# Patient Record
Sex: Female | Born: 1965 | Race: White | Hispanic: No | Marital: Married | Smoking: Never smoker
Health system: Southern US, Community
[De-identification: ages and names within clinical notes are randomized; demographics above are authoritative.]

## PROBLEM LIST (undated history)

## (undated) DIAGNOSIS — C44519 Basal cell carcinoma of skin of other part of trunk: Secondary | ICD-10-CM

## (undated) DIAGNOSIS — E785 Hyperlipidemia, unspecified: Secondary | ICD-10-CM

## (undated) DIAGNOSIS — Z8639 Personal history of other endocrine, nutritional and metabolic disease: Secondary | ICD-10-CM

## (undated) DIAGNOSIS — I10 Essential (primary) hypertension: Secondary | ICD-10-CM

## (undated) DIAGNOSIS — IMO0001 Reserved for inherently not codable concepts without codable children: Secondary | ICD-10-CM

## (undated) DIAGNOSIS — K219 Gastro-esophageal reflux disease without esophagitis: Secondary | ICD-10-CM

## (undated) HISTORY — DX: Hyperlipidemia, unspecified: E78.5

## (undated) HISTORY — DX: Personal history of other endocrine, nutritional and metabolic disease: Z86.39

## (undated) HISTORY — DX: Basal cell carcinoma of skin of other part of trunk: C44.519

## (undated) HISTORY — DX: Gastro-esophageal reflux disease without esophagitis: K21.9

## (undated) HISTORY — DX: Reserved for inherently not codable concepts without codable children: IMO0001

## (undated) HISTORY — PX: OTHER SURGICAL HISTORY: SHX169

## (undated) HISTORY — DX: Essential (primary) hypertension: I10

---

## 1998-07-15 ENCOUNTER — Other Ambulatory Visit: Admission: RE | Admit: 1998-07-15 | Discharge: 1998-07-15 | Payer: Self-pay | Admitting: Obstetrics and Gynecology

## 1999-06-23 ENCOUNTER — Other Ambulatory Visit: Admission: RE | Admit: 1999-06-23 | Discharge: 1999-06-23 | Payer: Self-pay | Admitting: Gynecology

## 1999-09-29 ENCOUNTER — Encounter: Admission: RE | Admit: 1999-09-29 | Discharge: 1999-12-28 | Payer: Self-pay | Admitting: Obstetrics and Gynecology

## 2000-01-04 ENCOUNTER — Inpatient Hospital Stay (HOSPITAL_COMMUNITY): Admission: AD | Admit: 2000-01-04 | Discharge: 2000-01-06 | Payer: Self-pay | Admitting: Gynecology

## 2000-01-07 ENCOUNTER — Encounter: Admission: RE | Admit: 2000-01-07 | Discharge: 2000-02-05 | Payer: Self-pay | Admitting: Gynecology

## 2000-02-19 ENCOUNTER — Other Ambulatory Visit: Admission: RE | Admit: 2000-02-19 | Discharge: 2000-02-19 | Payer: Self-pay | Admitting: Gynecology

## 2001-04-26 ENCOUNTER — Other Ambulatory Visit: Admission: RE | Admit: 2001-04-26 | Discharge: 2001-04-26 | Payer: Self-pay | Admitting: Gynecology

## 2001-11-03 ENCOUNTER — Inpatient Hospital Stay (HOSPITAL_COMMUNITY): Admission: AD | Admit: 2001-11-03 | Discharge: 2001-11-05 | Payer: Self-pay | Admitting: Gynecology

## 2001-12-15 ENCOUNTER — Other Ambulatory Visit: Admission: RE | Admit: 2001-12-15 | Discharge: 2001-12-15 | Payer: Self-pay | Admitting: Gynecology

## 2005-03-24 ENCOUNTER — Other Ambulatory Visit: Admission: RE | Admit: 2005-03-24 | Discharge: 2005-03-24 | Payer: Self-pay | Admitting: Gynecology

## 2007-04-01 ENCOUNTER — Encounter: Admission: RE | Admit: 2007-04-01 | Discharge: 2007-04-01 | Payer: Self-pay | Admitting: Gynecology

## 2007-06-10 ENCOUNTER — Other Ambulatory Visit: Admission: RE | Admit: 2007-06-10 | Discharge: 2007-06-10 | Payer: Self-pay | Admitting: Gynecology

## 2010-12-12 NOTE — Discharge Summary (Signed)
[facility]  Patient:    Misty Lara, Misty Lara                         MRN: [id number] Adm. Date:  [id number] Disc. Date: [id number] Attending:  Merrily Pew Dictator:   Antony Contras, [facility]                           Discharge Summary  DISCHARGE DIAGNOSES:          1. Intrauterine pregnancy at 38+ weeks.                               2. Insulin dependent gestational diabetes.  PROCEDURES:                   1. Mityvac assisted vaginal delivery of a                                  viable female infant.                               2. Repair of vaginal laceration.  HISTORY OF PRESENT ILLNESS:   The patient is a 45 year old gravida 2, para 0-0-1-0 with an LMP of March 30, 1999 and an [facility] of January 12, 2000. Her pregnancy was complicated by gestational diabetes requiring insulin management.  PRENATAL LABS:                Blood type A positive.  Antibody screen negative.  Rubella immune.  RPR, HBSAG, and HIV nonreactive.  MSAFP was within normal limits.  GBS negative.  HOSPITAL COURSE:              The patient presented on January 04, 2000 with regular contractions every five minutes.  The cervix was 3-4 cm, 95% effaced, vertex at -2 station.  Morning glucose was 76.  Labor did progress to complete dilatation.  Delivery was accomplished with Mityvac assistance.  The patient was delivered of an Apgar 8 and 15 female infant weighing 7 pounds 12 ounces over no episiotomy, but a vaginal laceration, which was successfully repaired with 3-0 Vicryl.  Her postpartum course was uncomplicated.  She remained afebrile.  No difficulty voiding.  Postpartum CBC showed hematocrit 27, hemoglobin 9.2, white count 17.1, platelets 193.  She was able to be discharged on her second postpartum day in satisfactory condition.  DISPOSITION:                  Follow up in the office in six weeks.  COntinue prenatal vitamins and iron.  Motrin and Tylox for pain. DD:  01/27/00 TD:   01/27/00 Job: [id number]

## 2010-12-12 NOTE — H&P (Signed)
[place]  Patient:    Misty Lara, Misty Lara                         MRN: [id number] Adm. Date:  01/04/00 Attending:  Marcial Pacas P. Fontaine, M.D.                         History and Physical  CHIEF COMPLAINT: 1. Pregnancy, at term. 2. Insulin-dependent gestational diabetic.  HISTORY OF PRESENT ILLNESS:  This 45 year old GII, P0, ABI female at 38+ weeks gestation, enters with regular contractions every five minutes at 3-4 cm, 95% vertex presentation, -2 station.  The patients pregnancy has been complicated by insulin dependent diabetes with good glucose control, for which she takes 10 units NPH at bedtime, 10 units NPH q.a.m.  PAST MEDICAL HISTORY:  Insulin dependent gestational diabetic.  PAST SURGICAL HISTORY:  None.  ALLERGIES:  Nonsteroidal anti-inflammatory.  FAMILY HISTORY:  Noncontributory.  REVIEW OF SYSTEMS:  Noncontributory.  SOCIAL HISTORY:  Noncontributory.  PHYSICAL EXAMINATION:  VITAL SIGNS:  Stable; afebrile.  HEENT:  Normal.  LUNGS:  Clear.  CARDIAC:  Regular rate without murmurs, rubs or gallops.  ABDOMEN:  Gravid, vertex presentation.  External monitor shows contractions every three minutes with reactive fetal tracing.  PELVIC:  Cervix complete, 6 cm and -1/-2 station; bulging _________, artificial  rupture of membranes, clear fluid.  ASSESSMENT: 85. 45 year old GI, P0, ABI female at 38+ weeks. 2. Insulin dependent gestational diabetic.  PLAN:  A.M. glucose now 76, in active labor.  Will monitor glucose throughout labor.  Withhold insulin protocol at present, unless necessary in anticipation f vaginal delivery. DD:  01/04/00 TD:  01/04/00 Job: [id number]

## 2010-12-12 NOTE — Discharge Summary (Signed)
[facility]  Patient:    Misty Lara, Misty Lara Visit Number: [id number] MRN: [id number]          Service Type: OBS Location: [id number] Attending Physician:  Merrily Pew Dictated by:   Antony Contras, [facility] Admit Date:  11/03/2001 Discharge Date: 11/05/2001                             Discharge Summary  DISCHARGE DIAGNOSES:          1. Intrauterine pregnancy at 36 to 37 weeks.                               2. Gestational diabetes, insulin dependent.  PROCEDURE:                    Normal spontaneous vaginal delivery of viable infant over intact perineum with repair of small laceration.  HISTORY OF PRESENT ILLNESS:   The patient is a 45 year old, gravida 3, para 1-0-1-1, with LMP of February 21, 2001, [facility] Nov 28, 2001.  Prenatal course was complicated by gestational diabetes for which the patient was placed on NPH insulin.  LABORATORY DATA:              Blood type A positive, antibody screen negative. RPR, HBSAG, HIV nonreactive.  MSAFP normal.  GBS negative.  HOSPITAL COURSE:              The patient was admitted on November 03, 2001, at 36 to 37 weeks with spontaneous onset of labor.  She progressed to complete dilatation and delivered an Apgars 9 and 46 female infant weighing 7 pounds 7 ounces over an intact perineum with repair of small laceration.  Postpartum course, the patient remained afebrile with no difficulty voiding. She was discharged in satisfactory condition on her second postpartum day. CBC; hematocrit 30.5, hemoglobin 10.5, WBC 11.8, platelets 160.  DISPOSITION:                  Follow up in six weeks.  Continue prenatal vitamins and iron.  Motrin or Tylox for pain. Dictated by:   Antony Contras, [facility] Attending Physician:  Merrily Pew DD:  11/28/01 TD:  11/30/01 Job: [id number]

## 2012-04-07 ENCOUNTER — Other Ambulatory Visit: Payer: Self-pay | Admitting: Gynecology

## 2012-04-07 DIAGNOSIS — Z1231 Encounter for screening mammogram for malignant neoplasm of breast: Secondary | ICD-10-CM

## 2012-04-26 ENCOUNTER — Ambulatory Visit
Admission: RE | Admit: 2012-04-26 | Discharge: 2012-04-26 | Disposition: A | Discharge status: Home / Self Care | Payer: 59 | Source: Ambulatory Visit | Attending: Gynecology | Admitting: Gynecology

## 2012-04-26 DIAGNOSIS — Z1231 Encounter for screening mammogram for malignant neoplasm of breast: Secondary | ICD-10-CM

## 2012-05-09 DIAGNOSIS — I10 Essential (primary) hypertension: Secondary | ICD-10-CM | POA: Insufficient documentation

## 2012-05-12 ENCOUNTER — Ambulatory Visit: Payer: Self-pay | Admitting: Gynecology

## 2012-06-15 ENCOUNTER — Ambulatory Visit: Payer: Self-pay | Admitting: Gynecology

## 2012-06-16 ENCOUNTER — Encounter: Payer: Self-pay | Admitting: Gynecology

## 2012-06-16 ENCOUNTER — Ambulatory Visit (INDEPENDENT_AMBULATORY_CARE_PROVIDER_SITE_OTHER): Payer: 59 | Admitting: Gynecology

## 2012-06-16 ENCOUNTER — Other Ambulatory Visit (HOSPITAL_COMMUNITY)
Admission: RE | Admit: 2012-06-16 | Discharge: 2012-06-16 | Disposition: A | Discharge status: Home / Self Care | Payer: 59 | Source: Ambulatory Visit | Attending: Gynecology | Admitting: Gynecology

## 2012-06-16 VITALS — BP 120/70 | Ht 60.0 in | Wt 168.0 lb

## 2012-06-16 DIAGNOSIS — Z01419 Encounter for gynecological examination (general) (routine) without abnormal findings: Secondary | ICD-10-CM

## 2012-06-16 DIAGNOSIS — I10 Essential (primary) hypertension: Secondary | ICD-10-CM

## 2012-06-16 DIAGNOSIS — Z13220 Encounter for screening for lipoid disorders: Secondary | ICD-10-CM

## 2012-06-16 DIAGNOSIS — Z1151 Encounter for screening for human papillomavirus (HPV): Secondary | ICD-10-CM | POA: Insufficient documentation

## 2012-06-16 DIAGNOSIS — Z131 Encounter for screening for diabetes mellitus: Secondary | ICD-10-CM

## 2012-06-16 LAB — CBC WITH DIFFERENTIAL/PLATELET
Eosinophils Absolute: 0.1 10*3/uL (ref 0.0–0.7)
Eosinophils Relative: 1 % (ref 0–5)
Hemoglobin: 14.2 g/dL (ref 12.0–15.0)
Lymphocytes Relative: 25 % (ref 12–46)
Lymphs Abs: 1.7 10*3/uL (ref 0.7–4.0)
MCH: 31.3 pg (ref 26.0–34.0)
MCV: 91.6 fL (ref 78.0–100.0)
Monocytes Relative: 7 % (ref 3–12)
RBC: 4.54 MIL/uL (ref 3.87–5.11)

## 2012-06-16 NOTE — Progress Notes (Signed)
Misty Lara 23-Jul-1966 [id number]        46 y.o.  [id number] for annual exam.  Has not been in for several years.  Past medical history,surgical history, medications, allergies, family history and social history were all reviewed and documented in the EPIC chart. ROS:  Was performed and pertinent positives and negatives are included in the history.  Exam: [facility] assistant Filed Vitals:   06/16/12 0929  BP: 120/70  Height: 5' (1.524 m)  Weight: 168 lb (76.204 kg)   General appearance  Normal Skin grossly normal Head/Neck normal with no cervical or supraclavicular adenopathy thyroid normal Lungs  clear Cardiac RR, without RMG Abdominal  soft, nontender, without masses, organomegaly or hernia Breasts  examined lying and sitting without masses, retractions, discharge or axillary adenopathy. Pelvic  Ext/BUS/vagina  normal   Cervix  normal Pap/HPV  Uterus  anteverted, normal size, shape and contour, midline and mobile nontender   Adnexa  Without masses or tenderness    Anus and perineum  normal   Rectovaginal  normal sphincter tone without palpated masses or tenderness.    Assessment/Plan:  45 y.o. [id number] female for annual exam, regular menses, vasectomy birth control.   1. History gestational diabetes. Check glucose and hemoglobin A1c. Weight loss with diet/exercise reviewed. 2. Hypertension. Well controlled. Check comprehensive metabolic panel. 3. Mammography 04/2012. Continue with annual mammography. SBE monthly reviewed. 4. Pap smear. Pap/HPV done. No history of abnormal Pap smears previously. Plan 5 year screening if normal. 5. Colonoscopy. Patient has planned this week. Does have history of adenomatous polyps and is actively being followed by gastroenterology. 6. Health maintenance. Baseline CBC comprehensive metabolic panel lipid profile TSH and urinalysis ordered. Follow up one year, sooner as needed  Addendum: Hemoglobin A1c subsequently declined at lab by  patient.  Dara Lords MD, 10:19 AM 06/16/2012

## 2012-06-16 NOTE — Patient Instructions (Signed)
Follow up for lab work Follow up annual exam in one year

## 2012-06-17 ENCOUNTER — Encounter: Payer: Self-pay | Admitting: Gynecology

## 2012-06-17 LAB — LIPID PANEL
Cholesterol: 187 mg/dL (ref 0–200)
Triglycerides: 76 mg/dL (ref <150)
VLDL: 15 mg/dL (ref 0–40)

## 2012-06-17 LAB — COMPREHENSIVE METABOLIC PANEL
CO2: 24 mEq/L (ref 19–32)
Calcium: 9.5 mg/dL (ref 8.4–10.5)
Chloride: 102 mEq/L (ref 96–112)
Glucose, Bld: 198 mg/dL — ABNORMAL HIGH (ref 70–99)
Sodium: 135 mEq/L (ref 135–145)
Total Bilirubin: 0.6 mg/dL (ref 0.3–1.2)
Total Protein: 6.8 g/dL (ref 6.0–8.3)

## 2012-06-17 LAB — URINALYSIS W MICROSCOPIC + REFLEX CULTURE
Bacteria, UA: NONE SEEN
Bilirubin Urine: NEGATIVE
Casts: NONE SEEN
Crystals: NONE SEEN
Glucose, UA: NEGATIVE mg/dL
Ketones, ur: 15 mg/dL — AB
RBC / HPF: >50 RBC/hpf — AB (ref <3)
Specific Gravity, Urine: 1.021 (ref 1.005–1.030)
Squamous Epithelial / LPF: NONE SEEN
Urobilinogen, UA: 0.2 mg/dL (ref 0.0–1.0)
pH: 5 (ref 5.0–8.0)

## 2012-06-20 ENCOUNTER — Other Ambulatory Visit: Payer: Self-pay | Admitting: Gynecology

## 2012-06-20 ENCOUNTER — Telehealth: Payer: Self-pay | Admitting: Gynecology

## 2012-06-20 MED ORDER — SULFAMETHOXAZOLE-TRIMETHOPRIM 800-160 MG PO TABS: 1.0000 | ORAL_TABLET | Freq: Two times a day (BID) | ORAL | Status: DC

## 2012-06-20 NOTE — Telephone Encounter (Signed)
Patient informed. She opted for Rx.  Rx sent to her pharmacy.

## 2012-06-20 NOTE — Telephone Encounter (Signed)
Patient with low level bacteria in her urine.  Was on her menses and there was lots of red cells also seen. Options are to treat with short course of antibiotic such as Septra DS 1 by mouth twice a day x3 days or to repeat a clean-catch urine off menses. I leave it up to the patient.

## 2012-07-07 ENCOUNTER — Encounter: Payer: Self-pay | Admitting: Gynecology

## 2013-06-01 ENCOUNTER — Other Ambulatory Visit: Payer: Self-pay

## 2013-06-28 ENCOUNTER — Other Ambulatory Visit: Payer: Self-pay

## 2013-06-28 DIAGNOSIS — Z1231 Encounter for screening mammogram for malignant neoplasm of breast: Secondary | ICD-10-CM

## 2013-07-18 ENCOUNTER — Encounter: Payer: Self-pay | Admitting: Women's Health

## 2013-08-09 ENCOUNTER — Ambulatory Visit: Payer: 59

## 2013-08-28 ENCOUNTER — Encounter: Payer: Self-pay | Admitting: Gynecology

## 2013-08-30 ENCOUNTER — Ambulatory Visit
Admission: RE | Admit: 2013-08-30 | Discharge: 2013-08-30 | Disposition: A | Discharge status: Home / Self Care | Payer: BC Managed Care – PPO | Source: Ambulatory Visit

## 2013-08-30 DIAGNOSIS — Z1231 Encounter for screening mammogram for malignant neoplasm of breast: Secondary | ICD-10-CM

## 2013-09-04 ENCOUNTER — Other Ambulatory Visit: Payer: Self-pay | Admitting: Gynecology

## 2013-09-04 DIAGNOSIS — R928 Other abnormal and inconclusive findings on diagnostic imaging of breast: Secondary | ICD-10-CM

## 2013-09-13 ENCOUNTER — Other Ambulatory Visit: Payer: BC Managed Care – PPO

## 2013-09-27 ENCOUNTER — Ambulatory Visit
Admission: RE | Admit: 2013-09-27 | Discharge: 2013-09-27 | Disposition: A | Discharge status: Home / Self Care | Payer: BC Managed Care – PPO | Source: Ambulatory Visit | Attending: Gynecology | Admitting: Gynecology

## 2013-09-27 ENCOUNTER — Other Ambulatory Visit: Payer: Self-pay | Admitting: Gynecology

## 2013-09-27 DIAGNOSIS — R2232 Localized swelling, mass and lump, left upper limb: Secondary | ICD-10-CM

## 2013-09-27 DIAGNOSIS — R928 Other abnormal and inconclusive findings on diagnostic imaging of breast: Secondary | ICD-10-CM

## 2014-05-28 ENCOUNTER — Encounter: Payer: Self-pay | Admitting: Gynecology

## 2015-04-15 ENCOUNTER — Ambulatory Visit (INDEPENDENT_AMBULATORY_CARE_PROVIDER_SITE_OTHER): Payer: BLUE CROSS/BLUE SHIELD | Admitting: Gynecology

## 2015-04-15 ENCOUNTER — Encounter: Payer: Self-pay | Admitting: Gynecology

## 2015-04-15 ENCOUNTER — Other Ambulatory Visit (HOSPITAL_COMMUNITY)
Admission: RE | Admit: 2015-04-15 | Discharge: 2015-04-15 | Disposition: A | Discharge status: Home / Self Care | Payer: BLUE CROSS/BLUE SHIELD | Source: Ambulatory Visit | Attending: Gynecology | Admitting: Gynecology

## 2015-04-15 VITALS — BP 124/80 | Ht 61.0 in | Wt 167.0 lb

## 2015-04-15 DIAGNOSIS — Z01411 Encounter for gynecological examination (general) (routine) with abnormal findings: Secondary | ICD-10-CM | POA: Insufficient documentation

## 2015-04-15 DIAGNOSIS — Z01419 Encounter for gynecological examination (general) (routine) without abnormal findings: Secondary | ICD-10-CM

## 2015-04-15 NOTE — Progress Notes (Signed)
Misty Lara 09-11-1965 [id number]        49 y.o.  [id number] for annual exam.  Doing well without complaints.  Past medical history,surgical history, problem list, medications, allergies, family history and social history were all reviewed and documented as reviewed in the EPIC chart.  ROS:  Performed with pertinent positives and negatives included in the history, assessment and plan.   Additional significant findings :  none   Exam: [facility] person Vitals:   04/15/15 1420  BP: 124/80  Height:  (1.549 m)  Weight: 167 lb (75.751 kg)   General appearance:  Normal affect, orientation and appearance. Skin: Grossly normal HEENT: Without gross lesions.  No cervical or supraclavicular adenopathy. Thyroid normal.  Lungs:  Clear without wheezing, rales or rhonchi Cardiac: RR, without RMG Abdominal:  Soft, nontender, without masses, guarding, rebound, organomegaly or hernia Breasts:  Examined lying and sitting without masses, retractions, discharge or axillary adenopathy. Pelvic:  Ext/BUS/vagina slight vaginal staining as menses started today  Cervix grossly normal. Pap smear done  Uterus anteverted, normal size, shape and contour, midline and mobile nontender   Adnexa  Without masses or tenderness    Anus and perineum  Normal   Rectovaginal  Normal sphincter tone without palpated masses or tenderness.    Assessment/Plan:  49 y.o. [id number] female for annual exam with regular menses, vasectomy birth control.   1. Mammography 09/2013. Patient reminded she is overdue and agrees to schedule. SBE monthly reviewed. 2. Pap smear/HPV negative 2013. Pap smear done today. No history of abnormal Pap smears previously. 3. Colonoscopy 2013 with recommended repeat interval 5 years. 4. Health maintenance. Being followed by her primary physician for hypertension. Has all of her routine blood work done through their office and will follow up with them. Follow up in one year, sooner as  needed.   Dara Lords MD, 2:45 PM 04/15/2015

## 2015-04-15 NOTE — Patient Instructions (Signed)
Call to Schedule your mammogram  Facilities in [place]: 1)  [facility], [place] Phone: [phone])  [facility]. [address] [place] Phone: [phone] 3)  Dr. Isaiah Blakes at [facility] [address] Phone: [phone]     Mammogram A mammogram is an X-ray test to find changes in a woman's breast. You should get a mammogram if:  You are 48 years of age or older  You have risk factors.   Your doctor recommends that you have one.  BEFORE THE TEST  Do not schedule the test the week before your period, especially if your breasts are sore during this time.  On the day of your mammogram:  Wash your breasts and armpits well. After washing, do not put on any deodorant or talcum powder on until after your test.   Eat and drink as you usually do.   Take your medicines as usual.   If you are diabetic and take insulin, make sure you:   Eat before coming for your test.   Take your insulin as usual.   If you cannot keep your appointment, call before the appointment to cancel. Schedule another appointment.  TEST  You will need to undress from the waist up. You will put on a hospital gown.   Your breast will be put on the mammogram machine, and it will press firmly on your breast with a piece of plastic called a compression paddle. This will make your breast flatter so that the machine can X-ray all parts of your breast.   Both breasts will be X-rayed. Each breast will be X-rayed from above and from the side. An X-ray might need to be taken again if the picture is not good enough.   The mammogram will last about 15 to 30 minutes.  AFTER THE TEST Finding out the results of your test Ask when your test results will be ready. Make sure you get your test results.  Document Released: 10/09/2008 Document Revised: 07/02/2011 Document Reviewed: 10/09/2008 [facility] Patient  Information 2012 [place].  You may obtain a copy of any labs that were done today by logging onto MyChart as outlined in the instructions provided with your AVS (after visit summary). The office will not call with normal lab results but certainly if there are any significant abnormalities then we will contact you.   Health Maintenance Adopting a healthy lifestyle and getting preventive care can go a long way to promote health and wellness. Talk with your health care Scharlene Catalina about what schedule of regular examinations is right for you. This is a good chance for you to check in with your Thereasa Iannello about disease prevention and staying healthy. In between checkups, there are plenty of things you can do on your own. Experts have done a lot of research about which lifestyle changes and preventive measures are most likely to keep you healthy. Ask your health care Jazzmyn Filion for more information. WEIGHT AND DIET  Eat a healthy diet  Be sure to include plenty of vegetables, fruits, low-fat dairy products, and lean protein.  Do not eat a lot of foods high in solid fats, added sugars, or salt.  Get regular exercise. This is one of the most important things you can do for your health. 1. Most adults should exercise for at least 150 minutes each week. The exercise should increase your heart rate and make you sweat (moderate-intensity exercise).  2. Most adults should also do strengthening exercises at least twice a week. This is in addition to the moderate-intensity exercise.  Maintain a healthy weight  Body mass index (BMI) is a measurement that can be used to identify possible weight problems. It estimates body fat based on height and weight. Your health care Caylon Saine can help determine your BMI and help you achieve or maintain a healthy weight.  For females 34 years of age and older:  1. A BMI below 18.5 is considered underweight. 2. A BMI of 18.5 to 24.9 is normal. 3. A BMI of 25 to 29.9 is  considered overweight. 4. A BMI of 30 and above is considered obese.  Watch levels of cholesterol and blood lipids  You should start having your blood tested for lipids and cholesterol at 48 years of age, then have this test every 5 years.  You may need to have your cholesterol levels checked more often if: 1. Your lipid or cholesterol levels are high. 2. You are older than 48 years of age. 3. You are at high risk for heart disease.  CANCER SCREENING   Lung Cancer  Lung cancer screening is recommended for adults 60-55 years old who are at high risk for lung cancer because of a history of smoking.  A yearly low-dose CT scan of the lungs is recommended for people who: 1. Currently smoke. 2. Have quit within the past 15 years. 3. Have at least a 30-pack-year history of smoking. A pack year is smoking an average of one pack of cigarettes a day for 1 year.  Yearly screening should continue until it has been 15 years since you quit.  Yearly screening should stop if you develop a health problem that would prevent you from having lung cancer treatment.  Breast Cancer  Practice breast self-awareness. This means understanding how your breasts normally appear and feel.  It also means doing regular breast self-exams. Let your health care Aizlyn Schifano know about any changes, no matter how small.  If you are in your 20s or 30s, you should have a clinical breast exam (CBE) by a health care Latasha Puskas every 1-3 years as part of a regular health exam.  If you are 82 or older, have a CBE every year. Also consider having a breast X-ray (mammogram) every year.  If you have a family history of breast cancer, talk to your health care Aleea Hendry about genetic screening.  If you are at high risk for breast cancer, talk to your health care Kiyaan Haq about having an MRI and a mammogram every year.  Breast cancer gene (BRCA) assessment is recommended for women who have family members with BRCA-related cancers.  BRCA-related cancers include:  Breast.  Ovarian.  Tubal.  Peritoneal cancers.  Results of the assessment will determine the need for genetic counseling and BRCA1 and BRCA2 testing. Cervical Cancer Routine pelvic examinations to screen for cervical cancer are no longer recommended for nonpregnant women who are considered low risk for cancer of the pelvic organs (ovaries, uterus, and vagina) and who do not have symptoms. A pelvic examination may be necessary if you have symptoms including those associated with pelvic infections. Ask your health care Maryfrances Portugal if a screening pelvic exam is right for you.   The Pap test is the screening test for cervical cancer for women who are considered at risk.  If you had a hysterectomy for a problem that was not cancer or a condition that could lead to cancer, then you no longer  need Pap tests.  If you are older than 65 years, and you have had normal Pap tests for the past 10 years, you no longer need to have Pap tests.  If you have had past treatment for cervical cancer or a condition that could lead to cancer, you need Pap tests and screening for cancer for at least 20 years after your treatment.  If you no longer get a Pap test, assess your risk factors if they change (such as having a new sexual partner). This can affect whether you should start being screened again.  Some women have medical problems that increase their chance of getting cervical cancer. If this is the case for you, your health care Starleen Trussell may recommend more frequent screening and Pap tests.  The human papillomavirus (HPV) test is another test that may be used for cervical cancer screening. The HPV test looks for the virus that can cause cell changes in the cervix. The cells collected during the Pap test can be tested for HPV.  The HPV test can be used to screen women 40 years of age and older. Getting tested for HPV can extend the interval between normal Pap tests from three to  five years.  An HPV test also should be used to screen women of any age who have unclear Pap test results.  After 48 years of age, women should have HPV testing as often as Pap tests.  Colorectal Cancer  This type of cancer can be detected and often prevented.  Routine colorectal cancer screening usually begins at 48 years of age and continues through 48 years of age.  Your health care Artavis Cowie may recommend screening at an earlier age if you have risk factors for colon cancer.  Your health care [place] Dorner may also recommend using home test kits to check for hidden blood in the stool.  A small camera at the end of a tube can be used to examine your colon directly (sigmoidoscopy or colonoscopy). This is done to check for the earliest forms of colorectal cancer.  Routine screening usually begins at age 35.  Direct examination of the colon should be repeated every 5-10 years through 48 years of age. However, you may need to be screened more often if early forms of precancerous polyps or small growths are found. Skin Cancer  Check your skin from head to toe regularly.  Tell your health care Rana Adorno about any new moles or changes in moles, especially if there is a change in a mole's shape or color.  Also tell your health care Daylon Lafavor if you have a mole that is larger than the size of a pencil eraser.  Always use sunscreen. Apply sunscreen liberally and repeatedly throughout the day.  Protect yourself by wearing long sleeves, pants, a wide-brimmed hat, and sunglasses whenever you are outside. HEART DISEASE, DIABETES, AND HIGH BLOOD PRESSURE   Have your blood pressure checked at least every 1-2 years. High blood pressure causes heart disease and increases the risk of stroke.  If you are between 83 years and 48 years old, ask your health care Alann Avey if you should take aspirin to prevent strokes.  Have regular diabetes screenings. This involves taking a blood sample to check your  fasting blood sugar level.  If you are at a normal weight and have a low risk for diabetes, have this test once every three years after 48 years of age.  If you are overweight and have a high risk for diabetes, consider being tested  at a younger age or more often. PREVENTING INFECTION  Hepatitis B  If you have a higher risk for hepatitis B, you should be screened for this virus. You are considered at high risk for hepatitis B if:  You were born in a country where hepatitis B is common. Ask your health care Tela Kotecki which countries are considered high risk.  Your parents were born in a high-risk country, and you have not been immunized against hepatitis B (hepatitis B vaccine).  You have HIV or AIDS.  You use needles to inject street drugs.  You live with someone who has hepatitis B.  You have had sex with someone who has hepatitis B.  You get hemodialysis treatment.  You take certain medicines for conditions, including cancer, organ transplantation, and autoimmune conditions. Hepatitis C  Blood testing is recommended for:  Everyone born from [birth year] through 1964.  Anyone with known risk factors for hepatitis C. Sexually transmitted infections (STIs)  You should be screened for sexually transmitted infections (STIs) including gonorrhea and chlamydia if:  You are sexually active and are younger than 48 years of age.  You are older than 48 years of age and your health care Shital Crayton tells you that you are at risk for this type of infection.  Your sexual activity has changed since you were last screened and you are at an increased risk for chlamydia or gonorrhea. Ask your health care Linus Weckerly if you are at risk.  If you do not have HIV, but are at risk, it may be recommended that you take a prescription medicine daily to prevent HIV infection. This is called pre-exposure prophylaxis (PrEP). You are considered at risk if:  You are sexually active and do not regularly use condoms or  know the HIV status of your partner(s).  You take drugs by injection.  You are sexually active with a partner who has HIV. Talk with your health care Averil Digman about whether you are at high risk of being infected with HIV. If you choose to begin PrEP, you should first be tested for HIV. You should then be tested every 3 months for as long as you are taking PrEP.  PREGNANCY   If you are premenopausal and you may become pregnant, ask your health care Leesa Leifheit about preconception counseling.  If you may become pregnant, take 400 to 800 micrograms (mcg) of folic acid every day.  If you want to prevent pregnancy, talk to your health care Darran Gabay about birth control (contraception). OSTEOPOROSIS AND MENOPAUSE   Osteoporosis is a disease in which the bones lose minerals and strength with aging. This can result in serious bone fractures. Your risk for osteoporosis can be identified using a bone density scan.  If you are 58 years of age or older, or if you are at risk for osteoporosis and fractures, ask your health care Yaneth Fairbairn if you should be screened.  Ask your health care Keefe Zawistowski whether you should take a calcium or vitamin D supplement to lower your risk for osteoporosis.  Menopause may have certain physical symptoms and risks.  Hormone replacement therapy may reduce some of these symptoms and risks. Talk to your health care Retal Tonkinson about whether hormone replacement therapy is right for you.  HOME CARE INSTRUCTIONS   Schedule regular health, dental, and eye exams.  Stay current with your immunizations.   Do not use any tobacco products including cigarettes, chewing tobacco, or electronic cigarettes.  If you are pregnant, do not drink alcohol.  If you are  breastfeeding, limit how much and how often you drink alcohol.  Limit alcohol intake to no more than 1 drink per day for nonpregnant women. One drink equals 12 ounces of beer, 5 ounces of wine, or 1 ounces of hard liquor.  Do  not use street drugs.  Do not share needles.  Ask your health care Raidyn Breiner for help if you need support or information about quitting drugs.  Tell your health care Taylon Louison if you often feel depressed.  Tell your health care Breella Vanostrand if you have ever been abused or do not feel safe at home. Document Released: 01/26/2011 Document Revised: 11/27/2013 Document Reviewed: 06/14/2013 [facility] Patient Information 2015 [place], [place]. This information is not intended to replace advice given to you by your health care Sherilynn Dieu. Make sure you discuss any questions you have with your health care Chrislynn Mosely.

## 2015-04-15 NOTE — Addendum Note (Signed)
Addended by: Dayna Barker on: 04/15/2015 03:01 PM   Modules accepted: Orders

## 2015-04-16 LAB — CYTOLOGY - PAP

## 2015-08-22 ENCOUNTER — Other Ambulatory Visit: Payer: Self-pay

## 2015-08-22 DIAGNOSIS — Z1231 Encounter for screening mammogram for malignant neoplasm of breast: Secondary | ICD-10-CM

## 2015-09-06 ENCOUNTER — Ambulatory Visit
Admission: RE | Admit: 2015-09-06 | Discharge: 2015-09-06 | Disposition: A | Discharge status: Home / Self Care | Payer: BLUE CROSS/BLUE SHIELD | Source: Ambulatory Visit

## 2015-09-06 DIAGNOSIS — Z1231 Encounter for screening mammogram for malignant neoplasm of breast: Secondary | ICD-10-CM

## 2016-04-22 ENCOUNTER — Ambulatory Visit (INDEPENDENT_AMBULATORY_CARE_PROVIDER_SITE_OTHER): Payer: BLUE CROSS/BLUE SHIELD | Admitting: Gynecology

## 2016-04-22 ENCOUNTER — Encounter: Payer: Self-pay | Admitting: Gynecology

## 2016-04-22 VITALS — BP 120/78 | Ht 61.0 in | Wt 168.0 lb

## 2016-04-22 DIAGNOSIS — Z01419 Encounter for gynecological examination (general) (routine) without abnormal findings: Secondary | ICD-10-CM | POA: Diagnosis not present

## 2016-04-22 NOTE — Patient Instructions (Signed)

## 2016-04-22 NOTE — Progress Notes (Signed)
    Misty CowerCheryl A Lara 12/19/1965 [id number]        50 y.o.  [id number]  for annual exam.  Doing well.  Past medical history,surgical history, problem list, medications, allergies, family history and social history were all reviewed and documented as reviewed in the EPIC chart.  ROS:  Performed with pertinent positives and negatives included in the history, assessment and plan.   Additional significant findings :  None   Exam: Kennon PortelaKim Gardner assistant Vitals:   04/22/16 1522  BP: 120/78  Weight: 168 lb (76.2 kg)  Height: 5\' 1"  (1.549 m)   Body mass index is 31.74 kg/m.  General appearance:  Normal affect, orientation and appearance. Skin: Grossly normal HEENT: Without gross lesions.  No cervical or supraclavicular adenopathy. Thyroid normal.  Lungs:  Clear without wheezing, rales or rhonchi Cardiac: RR, without RMG Abdominal:  Soft, nontender, without masses, guarding, rebound, organomegaly or hernia Breasts:  Examined lying and sitting without masses, retractions, discharge or axillary adenopathy. Pelvic:  Ext, BUS, Vagina normal  Cervix normal  Uterus anteverted, normal size, shape and contour, midline and mobile nontender   Adnexa without masses or tenderness    Anus and perineum normal   Rectovaginal normal sphincter tone without palpated masses or tenderness.    Assessment/Plan:  50 y.o. [id number] female for annual exam with regular menses, vasectomy birth control.   1. Pap smear 2016. No Pap smear done today.  No history of abnormal Pap smears previously. 2. Colonoscopy 2013 with planned repeat next year. 3. Mammography 08/2015. Continue with annual mammography when due. SBE monthly reviewed. 4. Health maintenance. No routine lab work done as this is done elsewhere. Follow up in one year, sooner as needed.   [place] LordsFONTAINE,Johnathan Tortorelli P MD, 3:46 PM 04/22/2016

## 2016-08-28 ENCOUNTER — Other Ambulatory Visit: Payer: Self-pay | Admitting: Family Medicine

## 2016-08-28 DIAGNOSIS — Z1231 Encounter for screening mammogram for malignant neoplasm of breast: Secondary | ICD-10-CM

## 2016-09-18 ENCOUNTER — Ambulatory Visit
Admission: RE | Admit: 2016-09-18 | Discharge: 2016-09-18 | Disposition: A | Discharge status: Home / Self Care | Payer: BLUE CROSS/BLUE SHIELD | Source: Ambulatory Visit | Attending: Family Medicine | Admitting: Family Medicine

## 2016-09-18 DIAGNOSIS — Z1231 Encounter for screening mammogram for malignant neoplasm of breast: Secondary | ICD-10-CM

## 2017-04-16 ENCOUNTER — Ambulatory Visit (INDEPENDENT_AMBULATORY_CARE_PROVIDER_SITE_OTHER): Payer: BLUE CROSS/BLUE SHIELD | Admitting: Obstetrics & Gynecology

## 2017-04-16 ENCOUNTER — Encounter: Payer: Self-pay | Admitting: Obstetrics & Gynecology

## 2017-04-16 VITALS — BP 164/94 | Ht 60.0 in | Wt 172.0 lb

## 2017-04-16 DIAGNOSIS — Z9189 Other specified personal risk factors, not elsewhere classified: Secondary | ICD-10-CM

## 2017-04-16 DIAGNOSIS — N951 Menopausal and female climacteric states: Secondary | ICD-10-CM

## 2017-04-16 DIAGNOSIS — Z23 Encounter for immunization: Secondary | ICD-10-CM | POA: Diagnosis not present

## 2017-04-16 DIAGNOSIS — Z01419 Encounter for gynecological examination (general) (routine) without abnormal findings: Secondary | ICD-10-CM

## 2017-04-16 DIAGNOSIS — R87618 Other abnormal cytological findings on specimens from cervix uteri: Secondary | ICD-10-CM | POA: Diagnosis not present

## 2017-04-16 DIAGNOSIS — Z1151 Encounter for screening for human papillomavirus (HPV): Secondary | ICD-10-CM

## 2017-04-16 NOTE — Progress Notes (Signed)
Misty Lara 11-Dec-1965 [id number]   History:    51 y.o. G3P2A1L2  Married.  Vasectomy.  Nurse practitioner.  Daughter 1 yo, son 78 yo.  Home schooling on line.  RP:  Established patient presenting for annual gyn exam   HPI:  Oligomenorrhea with LMP 11/2016, normal flow.  Occasional hot flushes.  No pelvic pain.  Normal vaginal secretions.  Breasts wnl.  Mictions/BMs wnl.  Past medical history,surgical history, family history and social history were all reviewed and documented in the EPIC chart.  Gynecologic History No LMP recorded. Contraception: vasectomy Last Pap: 03/2015. Results were: normal Last mammogram: 08/2016. Results were: Neg Colono 05/2012 q5 yrs, will schedule this year.  Obstetric History OB History  Gravida Para Term Preterm AB Living  SAB TAB Ectopic Multiple Live Births  1            # Outcome Date GA Lbr Len/2nd Weight Sex Delivery Anes PTL Lv  3 Term           2 Term           1 SAB                ROS: A ROS was performed and pertinent positives and negatives are included in the history.  GENERAL: No fevers or chills. HEENT: No change in vision, no earache, sore throat or sinus congestion. NECK: No pain or stiffness. CARDIOVASCULAR: No chest pain or pressure. No palpitations. PULMONARY: No shortness of breath, cough or wheeze. GASTROINTESTINAL: No abdominal pain, nausea, vomiting or diarrhea, melena or bright red blood per rectum. GENITOURINARY: No urinary frequency, urgency, hesitancy or dysuria. MUSCULOSKELETAL: No joint or muscle pain, no back pain, no recent trauma. DERMATOLOGIC: No rash, no itching, no lesions. ENDOCRINE: No polyuria, polydipsia, no heat or cold intolerance. No recent change in weight. HEMATOLOGICAL: No anemia or easy bruising or bleeding. NEUROLOGIC: No headache, seizures, numbness, tingling or weakness. PSYCHIATRIC: No depression, no loss of interest in normal activity or change in sleep pattern.     Exam:   BP (!)  164/94   Ht 5' (1.524 m)   Wt 172 lb (78 kg)   BMI 33.59 kg/m   Body mass index is 33.59 kg/m.  General appearance : Well developed well nourished female. No acute distress HEENT: Eyes: no retinal hemorrhage or exudates,  Neck supple, trachea midline, no carotid bruits, no thyroidmegaly Lungs: Clear to auscultation, no rhonchi or wheezes, or rib retractions  Heart: Regular rate and rhythm, no murmurs or gallops Breast:Examined in sitting and supine position were symmetrical in appearance, no palpable masses or tenderness,  no skin retraction, no nipple inversion, no nipple discharge, no skin discoloration, no axillary or supraclavicular lymphadenopathy Abdomen: no palpable masses or tenderness, no rebound or guarding Extremities: no edema or skin discoloration or tenderness  Pelvic: Vulva normal  Bartholin, Urethra, Skene Glands: Within normal limits             Vagina: No gross lesions or discharge  Cervix: No gross lesions or discharge.  Pap/HPV HR done.  Uterus  AV, normal size, shape and consistency, non-tender and mobile  Adnexa  Without masses or tenderness  Anus and perineum  normal    Assessment/Plan:  51 y.o. female for annual exam   1. Encounter for routine gynecological examination with Papanicolaou smear of cervix Normal gyn exam.  Pap/HPV HR done.  Breasts wnl.  Next Screening Mammo 08/2017.  2.  Perimenopause Will observe.  Mildly Sxic, tolerable.  Precautions for abnormal bleeding discussed.  3. Relies on partner vasectomy for contraception   Genia Del MD, 2:28 PM 04/16/2017

## 2017-04-18 NOTE — Patient Instructions (Signed)
1. Encounter for routine gynecological examination with Papanicolaou smear of cervix Normal gyn exam.  Pap/HPV HR done.  Breasts wnl.  Next Screening Mammo 08/2017.  2. Perimenopause Will observe.  Mildly Sxic, tolerable.  Precautions for abnormal bleeding discussed.  3. Relies on partner vasectomy for contraception  Misty Lara, it was a pleasure to meet you today!  I will inform you of your results as soon as available.  Health Maintenance, Female Adopting a healthy lifestyle and getting preventive care can go a long way to promote health and wellness. Talk with your health care provider about what schedule of regular examinations is right for you. This is a good chance for you to check in with your provider about disease prevention and staying healthy. In between checkups, there are plenty of things you can do on your own. Experts have done a lot of research about which lifestyle changes and preventive measures are most likely to keep you healthy. Ask your health care provider for more information. Weight and diet Eat a healthy diet  Be sure to include plenty of vegetables, fruits, low-fat dairy products, and lean protein.  Do not eat a lot of foods high in solid fats, added sugars, or salt.  Get regular exercise. This is one of the most important things you can do for your health. ? Most adults should exercise for at least 150 minutes each week. The exercise should increase your heart rate and make you sweat (moderate-intensity exercise). ? Most adults should also do strengthening exercises at least twice a week. This is in addition to the moderate-intensity exercise.  Maintain a healthy weight  Body mass index (BMI) is a measurement that can be used to identify possible weight problems. It estimates body fat based on height and weight. Your health care provider can help determine your BMI and help you achieve or maintain a healthy weight.  For females 16 years of age and older: ? A BMI  below 18.5 is considered underweight. ? A BMI of 18.5 to 24.9 is normal. ? A BMI of 25 to 29.9 is considered overweight. ? A BMI of 30 and above is considered obese.  Watch levels of cholesterol and blood lipids  You should start having your blood tested for lipids and cholesterol at 50 years of age, then have this test every 5 years.  You may need to have your cholesterol levels checked more often if: ? Your lipid or cholesterol levels are high. ? You are older than 50 years of age. ? You are at high risk for heart disease.  Cancer screening Lung Cancer  Lung cancer screening is recommended for adults 35-48 years old who are at high risk for lung cancer because of a history of smoking.  A yearly low-dose CT scan of the lungs is recommended for people who: ? Currently smoke. ? Have quit within the past 15 years. ? Have at least a 30-pack-year history of smoking. A pack year is smoking an average of one pack of cigarettes a day for 1 year.  Yearly screening should continue until it has been 15 years since you quit.  Yearly screening should stop if you develop a health problem that would prevent you from having lung cancer treatment.  Breast Cancer  Practice breast self-awareness. This means understanding how your breasts normally appear and feel.  It also means doing regular breast self-exams. Let your health care provider know about any changes, no matter how small.  If you are in your 42s  or 18s, you should have a clinical breast exam (CBE) by a health care provider every 1-3 years as part of a regular health exam.  If you are 73 or older, have a CBE every year. Also consider having a breast X-ray (mammogram) every year.  If you have a family history of breast cancer, talk to your health care provider about genetic screening.  If you are at high risk for breast cancer, talk to your health care provider about having an MRI and a mammogram every year.  Breast cancer gene  (BRCA) assessment is recommended for women who have family members with BRCA-related cancers. BRCA-related cancers include: ? Breast. ? Ovarian. ? Tubal. ? Peritoneal cancers.  Results of the assessment will determine the need for genetic counseling and BRCA1 and BRCA2 testing.  Cervical Cancer Your health care provider may recommend that you be screened regularly for cancer of the pelvic organs (ovaries, uterus, and vagina). This screening involves a pelvic examination, including checking for microscopic changes to the surface of your cervix (Pap test). You may be encouraged to have this screening done every 3 years, beginning at age 21.  For women ages 79-65, health care providers may recommend pelvic exams and Pap testing every 3 years, or they may recommend the Pap and pelvic exam, combined with testing for human papilloma virus (HPV), every 5 years. Some types of HPV increase your risk of cervical cancer. Testing for HPV may also be done on women of any age with unclear Pap test results.  Other health care providers may not recommend any screening for nonpregnant women who are considered low risk for pelvic cancer and who do not have symptoms. Ask your health care provider if a screening pelvic exam is right for you.  If you have had past treatment for cervical cancer or a condition that could lead to cancer, you need Pap tests and screening for cancer for at least 20 years after your treatment. If Pap tests have been discontinued, your risk factors (such as having a new sexual partner) need to be reassessed to determine if screening should resume. Some women have medical problems that increase the chance of getting cervical cancer. In these cases, your health care provider may recommend more frequent screening and Pap tests.  Colorectal Cancer  This type of cancer can be detected and often prevented.  Routine colorectal cancer screening usually begins at 50 years of age and continues  through 50 years of age.  Your health care provider may recommend screening at an earlier age if you have risk factors for colon cancer.  Your health care provider may also recommend using home test kits to check for hidden blood in the stool.  A small camera at the end of a tube can be used to examine your colon directly (sigmoidoscopy or colonoscopy). This is done to check for the earliest forms of colorectal cancer.  Routine screening usually begins at age 78.  Direct examination of the colon should be repeated every 5-10 years through 50 years of age. However, you may need to be screened more often if early forms of precancerous polyps or small growths are found.  Skin Cancer  Check your skin from head to toe regularly.  Tell your health care provider about any new moles or changes in moles, especially if there is a change in a mole's shape or color.  Also tell your health care provider if you have a mole that is larger than the size of a  pencil eraser.  Always use sunscreen. Apply sunscreen liberally and repeatedly throughout the day.  Protect yourself by wearing long sleeves, pants, a wide-brimmed hat, and sunglasses whenever you are outside.  Heart disease, diabetes, and high blood pressure  High blood pressure causes heart disease and increases the risk of stroke. High blood pressure is more likely to develop in: ? People who have blood pressure in the high end of the normal range (130-139/85-89 mm Hg). ? People who are overweight or obese. ? People who are African American.  If you are 34-42 years of age, have your blood pressure checked every 3-5 years. If you are 75 years of age or older, have your blood pressure checked every year. You should have your blood pressure measured twice-once when you are at a hospital or clinic, and once when you are not at a hospital or clinic. Record the average of the two measurements. To check your blood pressure when you are not at a  hospital or clinic, you can use: ? An automated blood pressure machine at a pharmacy. ? A home blood pressure monitor.  If you are between 44 years and 25 years old, ask your health care provider if you should take aspirin to prevent strokes.  Have regular diabetes screenings. This involves taking a blood sample to check your fasting blood sugar level. ? If you are at a normal weight and have a low risk for diabetes, have this test once every three years after 50 years of age. ? If you are overweight and have a high risk for diabetes, consider being tested at a younger age or more often. Preventing infection Hepatitis B  If you have a higher risk for hepatitis B, you should be screened for this virus. You are considered at high risk for hepatitis B if: ? You were born in a country where hepatitis B is common. Ask your health care provider which countries are considered high risk. ? Your parents were born in a high-risk country, and you have not been immunized against hepatitis B (hepatitis B vaccine). ? You have HIV or AIDS. ? You use needles to inject street drugs. ? You live with someone who has hepatitis B. ? You have had sex with someone who has hepatitis B. ? You get hemodialysis treatment. ? You take certain medicines for conditions, including cancer, organ transplantation, and autoimmune conditions.  Hepatitis C  Blood testing is recommended for: ? Everyone born from [birth year] through 1964. ? Anyone with known risk factors for hepatitis C.  Sexually transmitted infections (STIs)  You should be screened for sexually transmitted infections (STIs) including gonorrhea and chlamydia if: ? You are sexually active and are younger than 50 years of age. ? You are older than 50 years of age and your health care provider tells you that you are at risk for this type of infection. ? Your sexual activity has changed since you were last screened and you are at an increased risk for chlamydia or  gonorrhea. Ask your health care provider if you are at risk.  If you do not have HIV, but are at risk, it may be recommended that you take a prescription medicine daily to prevent HIV infection. This is called pre-exposure prophylaxis (PrEP). You are considered at risk if: ? You are sexually active and do not regularly use condoms or know the HIV status of your partner(s). ? You take drugs by injection. ? You are sexually active with a partner who has HIV.  Talk with your health care provider about whether you are at high risk of being infected with HIV. If you choose to begin PrEP, you should first be tested for HIV. You should then be tested every 3 months for as long as you are taking PrEP. Pregnancy  If you are premenopausal and you may become pregnant, ask your health care provider about preconception counseling.  If you may become pregnant, take 400 to 800 micrograms (mcg) of folic acid every day.  If you want to prevent pregnancy, talk to your health care provider about birth control (contraception). Osteoporosis and menopause  Osteoporosis is a disease in which the bones lose minerals and strength with aging. This can result in serious bone fractures. Your risk for osteoporosis can be identified using a bone density scan.  If you are 36 years of age or older, or if you are at risk for osteoporosis and fractures, ask your health care provider if you should be screened.  Ask your health care provider whether you should take a calcium or vitamin D supplement to lower your risk for osteoporosis.  Menopause may have certain physical symptoms and risks.  Hormone replacement therapy may reduce some of these symptoms and risks. Talk to your health care provider about whether hormone replacement therapy is right for you. Follow these instructions at home:  Schedule regular health, dental, and eye exams.  Stay current with your immunizations.  Do not use any tobacco products including  cigarettes, chewing tobacco, or electronic cigarettes.  If you are pregnant, do not drink alcohol.  If you are breastfeeding, limit how much and how often you drink alcohol.  Limit alcohol intake to no more than 1 drink per day for nonpregnant women. One drink equals 12 ounces of beer, 5 ounces of wine, or 1 ounces of hard liquor.  Do not use street drugs.  Do not share needles.  Ask your health care provider for help if you need support or information about quitting drugs.  Tell your health care provider if you often feel depressed.  Tell your health care provider if you have ever been abused or do not feel safe at home. This information is not intended to replace advice given to you by your health care provider. Make sure you discuss any questions you have with your health care provider. Document Released: 01/26/2011 Document Revised: 12/19/2015 Document Reviewed: 04/16/2015 Elsevier Interactive Patient Education  [employer].

## 2017-04-20 LAB — PAP, TP IMAGING W/ HPV RNA, RFLX HPV TYPE 16,18/45: HPV DNA HIGH RISK: NOT DETECTED

## 2018-04-19 ENCOUNTER — Encounter: Payer: Self-pay | Admitting: Gynecology

## 2018-04-19 ENCOUNTER — Ambulatory Visit (INDEPENDENT_AMBULATORY_CARE_PROVIDER_SITE_OTHER): Payer: BLUE CROSS/BLUE SHIELD | Admitting: Gynecology

## 2018-04-19 VITALS — BP 130/82 | Ht 60.0 in | Wt 174.0 lb

## 2018-04-19 DIAGNOSIS — Z01419 Encounter for gynecological examination (general) (routine) without abnormal findings: Secondary | ICD-10-CM

## 2018-04-19 NOTE — Patient Instructions (Addendum)
Schedule your mammogram  Follow-up if menopausal symptoms worsen and you want to discuss treatment options or you have prolonged or atypical bleeding.

## 2018-04-19 NOTE — Progress Notes (Signed)
    Misty CowerCheryl A Lara 09/14/1965 [id number]        52 y.o.  [id number] for annual gynecologic exam.  Without GYN complaints  Past medical history,surgical history, problem list, medications, allergies, family history and social history were all reviewed and documented as reviewed in the EPIC chart.  ROS:  Performed with pertinent positives and negatives included in the history, assessment and plan.   Additional significant findings : None   Exam: Kennon PortelaKim Gardner assistant Vitals:   04/19/18 1427  BP: 130/82  Weight: 174 lb (78.9 kg)  Height: 5' (1.524 m)   Body mass index is 33.98 kg/m.  General appearance:  Normal affect, orientation and appearance. Skin: Grossly normal HEENT: Without gross lesions.  No cervical or supraclavicular adenopathy. Thyroid normal.  Lungs:  Clear without wheezing, rales or rhonchi Cardiac: RR, without RMG Abdominal:  Soft, nontender, without masses, guarding, rebound, organomegaly or hernia Breasts:  Examined lying and sitting without masses, retractions, discharge or axillary adenopathy. Pelvic:  Ext, BUS, Vagina: Normal  Cervix: Normal  Uterus: Anteverted, normal size, shape and contour, midline and mobile nontender   Adnexa: Without masses or tenderness    Anus and perineum: Normal   Rectovaginal: Normal sphincter tone without palpated masses or tenderness.    Assessment/Plan:  52 y.o. [id number] female for annual gynecologic exam less frequent menses, vasectomy birth control.   1. Menopause.  Patient starting to space out her menses with LMP 11/2017.  Having some hot flushes and sweats.  Will keep symptom and menstrual calendar.  As long as no prolonged or atypical bleeding then will monitor.  If increasing symptoms patient will represent to discuss treatment options. 2. Mammography overdue and patient is going to call and schedule.  Breast exam normal today. 3. Pap smear/HPV 2018 by Dr Seymour BarsLavoie.  No Pap smear done today.  No history of significant abnormal  Pap smears. 4. Colonoscopy 2013.  Repeat at their recommended interval. 5. Health maintenance.  No routine lab work done as patient does this elsewhere.  Follow-up 1 year, sooner if issues with her perimenopause.   Dara Lordsimothy P Gilverto Dileonardo MD, 3:09 PM 04/19/2018

## 2019-03-06 ENCOUNTER — Encounter: Payer: BLUE CROSS/BLUE SHIELD | Admitting: Gynecology

## 2019-04-06 ENCOUNTER — Other Ambulatory Visit: Payer: Self-pay

## 2019-04-07 ENCOUNTER — Encounter: Payer: Self-pay | Admitting: Gynecology

## 2019-04-07 ENCOUNTER — Ambulatory Visit (INDEPENDENT_AMBULATORY_CARE_PROVIDER_SITE_OTHER): Payer: BC Managed Care – PPO | Admitting: Gynecology

## 2019-04-07 VITALS — BP 132/80 | Ht 60.0 in | Wt 174.0 lb

## 2019-04-07 DIAGNOSIS — Z01419 Encounter for gynecological examination (general) (routine) without abnormal findings: Secondary | ICD-10-CM | POA: Diagnosis not present

## 2019-04-07 NOTE — Progress Notes (Signed)
    Misty Lara 10/03/1965 [id number]        53 y.o.  [id number] for annual gynecologic exam.  Without gynecologic complaints.  Over a year without menses.  Not having significant menopausal symptoms  Past medical history,surgical history, problem list, medications, allergies, family history and social history were all reviewed and documented as reviewed in the EPIC chart.  ROS:  Performed with pertinent positives and negatives included in the history, assessment and plan.   Additional significant findings : None   Exam: Wandra Scot assistant Vitals:   04/07/19 0808  BP: 132/80  Weight: 174 lb (78.9 kg)  Height: 5' (1.524 m)   Body mass index is 33.98 kg/m.  General appearance:  Normal affect, orientation and appearance. Skin: Grossly normal HEENT: Without gross lesions.  No cervical or supraclavicular adenopathy. Thyroid normal.  Lungs:  Clear without wheezing, rales or rhonchi Cardiac: RR, without RMG Abdominal:  Soft, nontender, without masses, guarding, rebound, organomegaly or hernia Breasts:  Examined lying and sitting without masses, retractions, discharge or axillary adenopathy. Pelvic:  Ext, BUS, Vagina: Normal  Cervix: Normal  Uterus: Anteverted, normal size, shape and contour, midline and mobile nontender   Adnexa: Without masses or tenderness    Anus and perineum: Normal   Rectovaginal: Normal sphincter tone without palpated masses or tenderness.    Assessment/Plan:  53 y.o. [id number] female for annual gynecologic exam.   1. Postmenopausal.  Without significant menopausal symptoms or any vaginal bleeding. 2. Mammography overdue and patient is in the process of arranging.  Breast exam normal today. 3. Colonoscopy 2013.  Repeat at their recommended interval. 4. Pap smear/HPV 2018.  No Pap smear done today.  No history of abnormal Pap smears.  Plan repeat Pap smear/HPV at 5-year interval per current screening guidelines. 5. Health maintenance.  No routine lab work  done as patient does this elsewhere.  Follow-up 1 year, sooner as needed.   Anastasio Auerbach MD, 8:45 AM 04/07/2019

## 2019-04-07 NOTE — Patient Instructions (Signed)
Schedule your mammogram.  Follow-up in 1 year for annual exam. 

## 2019-04-19 ENCOUNTER — Encounter: Payer: Self-pay | Admitting: Gynecology

## 2019-04-25 DIAGNOSIS — I10 Essential (primary) hypertension: Secondary | ICD-10-CM | POA: Diagnosis not present

## 2019-04-25 DIAGNOSIS — N951 Menopausal and female climacteric states: Secondary | ICD-10-CM | POA: Diagnosis not present

## 2019-04-25 DIAGNOSIS — Z13220 Encounter for screening for lipoid disorders: Secondary | ICD-10-CM | POA: Diagnosis not present

## 2019-04-25 DIAGNOSIS — E559 Vitamin D deficiency, unspecified: Secondary | ICD-10-CM | POA: Diagnosis not present

## 2019-04-25 DIAGNOSIS — Z130 Encounter for screening for diseases of the blood and blood-forming organs and certain disorders involving the immune mechanism: Secondary | ICD-10-CM | POA: Diagnosis not present

## 2019-04-27 ENCOUNTER — Telehealth: Payer: Self-pay | Admitting: *Deleted

## 2019-04-27 NOTE — Telephone Encounter (Signed)
Patient called and the appointment desk c/o about bleeding x 7 days she is scheduled for OV on 05/25/19. Appointment desk told me patient would like ultrasound same day. I called patient to discuss bleeding/ultrasound she is not bleeding now. However I tried to get more information about her bleeding to I could relay information to you and patient declined to give me any information. She is requesting to speak with you via phone to discuss the bleeding and labs that she wants drawn. I did offer to schedule a tele-visit, but patient declined that as well. Patient states " it will only be a second conversation" and doesn't feel the need to schedule a tele-visit. Per the appointment desk the next ultrasound slot is the end of Oct. She said you may call her anytime, she will pick up.

## 2019-04-28 NOTE — Telephone Encounter (Signed)
Patient had period like bleeding for 5 days last week and now is doing some spotting.  Her last menstrual period was over a year ago.  We discussed the differential to include escaped ovulation, atrophic, polyp or other structural, hyperplastic and endometrial cancer.  Patient has a sonohysterogram scheduled in several weeks and she will follow-up for this.

## 2019-05-01 ENCOUNTER — Other Ambulatory Visit: Payer: Self-pay | Admitting: *Deleted

## 2019-05-01 DIAGNOSIS — N926 Irregular menstruation, unspecified: Secondary | ICD-10-CM

## 2019-05-03 ENCOUNTER — Other Ambulatory Visit: Payer: Self-pay

## 2019-05-08 ENCOUNTER — Other Ambulatory Visit: Payer: Self-pay | Admitting: Obstetrics and Gynecology

## 2019-05-08 ENCOUNTER — Ambulatory Visit (INDEPENDENT_AMBULATORY_CARE_PROVIDER_SITE_OTHER): Payer: BC Managed Care – PPO | Admitting: Obstetrics and Gynecology

## 2019-05-08 ENCOUNTER — Other Ambulatory Visit: Payer: Self-pay

## 2019-05-08 ENCOUNTER — Other Ambulatory Visit (HOSPITAL_COMMUNITY)
Admission: RE | Admit: 2019-05-08 | Discharge: 2019-05-08 | Disposition: A | Discharge status: Home / Self Care | Payer: BC Managed Care – PPO | Source: Ambulatory Visit | Attending: Obstetrics and Gynecology | Admitting: Obstetrics and Gynecology

## 2019-05-08 ENCOUNTER — Encounter: Payer: Self-pay | Admitting: Obstetrics and Gynecology

## 2019-05-08 VITALS — BP 130/84 | HR 92 | Temp 97.3°F | Ht 61.0 in | Wt 177.8 lb

## 2019-05-08 DIAGNOSIS — Z23 Encounter for immunization: Secondary | ICD-10-CM

## 2019-05-08 DIAGNOSIS — Z124 Encounter for screening for malignant neoplasm of cervix: Secondary | ICD-10-CM | POA: Insufficient documentation

## 2019-05-08 DIAGNOSIS — Z8049 Family history of malignant neoplasm of other genital organs: Secondary | ICD-10-CM | POA: Diagnosis not present

## 2019-05-08 DIAGNOSIS — N950 Postmenopausal bleeding: Secondary | ICD-10-CM | POA: Insufficient documentation

## 2019-05-08 NOTE — Progress Notes (Signed)
53 y.o. [id number] Married White or Caucasian Not Hispanic or Latino female here for PMB. No bleeding for a year. She then started bleeding on 04/08/19 for 7 days. Light flow. Mild cramping.  H/O vasomotor symptoms, got better about a year ago.  Not sexually active for 6 months.   Patient's last menstrual period was 04/08/2019 (exact date).          Sexually active: Yes.    The current method of family planning is post menopausal status.    Exercising: Yes.    elliptical, walking Smoker:  no  Health Maintenance: Pap:  04/16/2017 WNL NEG HPV History of abnormal Pap:  no MMG:  09/18/2016 Birads 1 negative BMD:   Never Colonoscopy: 06/17/2012 WNL TDaP:  Thinks overdue Gardasil: N/A   reports that she has never smoked. She has never used smokeless tobacco. She reports that she does not drink alcohol or use drugs. 73 year old son, 72 year old daughter (at [employer]). Son is a Equities trader. 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   Past Medical History:  Diagnosis Date  . Hyperlipemia   . Hypertension   . Reflux     Past Surgical History:  Procedure Laterality Date  . lymph node removed      Current Outpatient Medications  Medication Sig Dispense Refill  . aspirin 81 MG tablet Take 81 mg by mouth daily.    . Cholecalciferol (VITAMIN D PO) Take by mouth.    . irbesartan (AVAPRO) 300 MG tablet Take 300 mg by mouth at bedtime.    Marland Kitchen MAGNESIUM PO Take by mouth.    . Multiple Vitamin (MULTIVITAMIN) tablet Take 1 tablet by mouth daily.    . Omega-3 Fatty Acids (FISH OIL PO) Take by mouth.    . rosuvastatin (CRESTOR) 20 MG tablet Take 1 tablet by mouth daily.    . TURMERIC PO Take by mouth.     No current facility-administered medications for this visit.     Family History  Problem Relation Age of Onset  . Hypertension Mother   . Diabetes Father   . Stroke Father   . Cancer Maternal Grandmother        UTERINE    Review of Systems   Constitutional: Negative.   HENT: Negative.   Eyes: Negative.   Respiratory: Negative.   Cardiovascular: Negative.   Gastrointestinal: Negative.   Endocrine: Negative.   Genitourinary: Positive for menstrual problem.  Musculoskeletal: Negative.   Skin: Negative.   Allergic/Immunologic: Negative.   Neurological: Negative.   Hematological: Negative.   Psychiatric/Behavioral: Negative.     Exam:   BP 130/84 (BP Location: Right Arm, Patient Position: Sitting, Cuff Size: Normal)   Pulse 92   Temp (!) 97.3 F (36.3 C) (Temporal)   Ht 5\' 1"  (1.549 m)   Wt 177 lb 12.8 oz (80.6 kg)   LMP 04/08/2019 (Exact Date)   BMI 33.60 kg/m   Weight change: @WEIGHTCHANGE @ Height:   Height: 5\' 1"  (154.9 cm)  Ht Readings from Last 3 Encounters:  05/08/19 5\' 1"  (1.549 m)  04/07/19 5' (1.524 m)  04/19/18 5' (1.524 m)    General appearance: alert, cooperative and appears stated age Head: Normocephalic, without obvious abnormality, atraumatic Neck: no adenopathy, supple, symmetrical, trachea midline and thyroid normal to inspection and palpation Lungs: clear to auscultation bilaterally Cardiovascular: regular rate and rhythm Abdomen: soft, non-tender; non distended,  no masses,  no organomegaly Extremities: extremities normal,  atraumatic, no cyanosis or edema Skin: Skin color, texture, turgor normal. No rashes or lesions Lymph nodes: Cervical, supraclavicular, and axillary nodes normal. No abnormal inguinal nodes palpated Neurologic: Grossly normal   Pelvic: External genitalia:  no lesions              Urethra:  normal appearing urethra with no masses, tenderness or lesions              Bartholins and Skenes: normal                 Vagina: normal appearing vagina with normal color and discharge, no lesions              Cervix: no lesions and ectropion noted, friable               Bimanual Exam:  Uterus:  no masses or tenderness              Adnexa: no mass, fullness, tenderness                  The risks of endometrial biopsy were reviewed and a consent was obtained.  A speculum was placed in the vagina and the cervix was cleansed with betadine. A tenaculum was placed on the cervix and the pipelle was placed into the endometrial cavity. The uterus sounded to 6 cm. The endometrial biopsy was performed, minimal tissue was obtained. The tenaculum and speculum were removed. There were no complications.    Chaperone was present for exam.  A:  Postmenopausal bleeding  P:   Endometrial biopsy  Pap with hpv  Return for gyn ultrasound, possible sonohysterogram  Patient requests TDAP, given  CC: Dr Cyndia Bent

## 2019-05-08 NOTE — Patient Instructions (Signed)
  Postmenopausal Bleeding  Postmenopausal bleeding is any bleeding that a woman has after she has entered into menopause. Menopause is the end of a woman's fertile years. After menopause, a woman no longer ovulates and does not have menstrual periods. Postmenopausal bleeding may have various causes, including:  Menopausal hormone therapy (MHT).  Endometrial atrophy. After menopause, low estrogen hormone levels cause the membrane that lines the uterus (endometrium) to become thinner. You may have bleeding as the endometrium thins.  Endometrial hyperplasia. This condition is caused by excess estrogen hormones and low levels of progesterone hormones. The excess estrogen causes the endometrium to thicken, which can lead to bleeding. In some cases, this can lead to cancer of the uterus.  Endometrial cancer.  Non-cancerous growths (polyps) on the endometrium, the lining of the uterus, or the cervix.  Uterine fibroids. These are non-cancerous growths in or around the uterus muscle tissue that can cause heavy bleeding. Any type of postmenopausal bleeding, even if it appears to be a typical menstrual period, should be evaluated by your health care provider. Treatment will depend on the cause of the bleeding. Follow these instructions at home:  Pay attention to any changes in your symptoms.  Avoid using tampons and douches as told by your health care provider.  Change your pads regularly.  Get regular pelvic exams and Pap tests.  Take iron supplements as told by your health care provider.  Take over-the-counter and prescription medicines only as told by your health care provider.  Keep all follow-up visits as told by your health care provider. This is important. Contact a health care provider if:  Your bleeding lasts more than 1 week.  You have abdominal pain.  You have bleeding with or after sexual intercourse.  You have bleeding that happens more often than every 3 weeks. Get help  right away if:  You have a fever, chills, headache, dizziness, muscle aches, and bleeding.  You have severe pain with bleeding.  You are passing blood clots.  You have heavy bleeding, need more than 1 pad an hour, and have never experienced this before.  You feel faint. Summary  Postmenopausal bleeding is any bleeding that a woman has after she has entered into menopause.  Postmenopausal bleeding may have various causes. Treatment will depend on the cause of the bleeding.  Any type of postmenopausal bleeding, even if it appears to be a typical menstrual period, should be evaluated by your health care provider.  Be sure to pay attention to any changes in your symptoms and keep all follow-up visits as told by your health care provider. This information is not intended to replace advice given to you by your health care provider. Make sure you discuss any questions you have with your health care provider. Document Released: 10/21/2005 Document Revised: 10/20/2017 Document Reviewed: 10/06/2016 Elsevier Patient Education  2020 Elsevier Inc.  

## 2019-05-09 ENCOUNTER — Telehealth: Payer: Self-pay | Admitting: Obstetrics and Gynecology

## 2019-05-09 NOTE — Telephone Encounter (Signed)
Call placed to patient to review benefit for sonohysterogram. Patient was unable to talk, states will call back to schedule

## 2019-05-16 LAB — CYTOLOGY - PAP
Comment: NEGATIVE
Diagnosis: NEGATIVE
High risk HPV: NEGATIVE

## 2019-05-18 NOTE — Telephone Encounter (Signed)
Spoke with patient. Reviewed benefit for recommended sonohysterogram. Patient acknowledges understanding of information presented. Patient is scheduled with Dr Talbert Nan on 05/25/2019. Patient is aware of the appointment date, arrival time and cancellation policy. No further questions.   Forwarding to Dr Talbert Nan for final review. Will close encounter

## 2019-05-23 NOTE — Progress Notes (Signed)
GYNECOLOGY  VISIT   HPI: 53 y.o.   Married White or Caucasian Not Hispanic or Latino  female   [phone] with Patient's last menstrual period was 04/08/2019 (exact date).   here for further evaluation of PMP bleeding. Normal pap, negative endometrial biopsy.    GYNECOLOGIC HISTORY: Patient's last menstrual period was 04/08/2019 (exact date). Contraception:PMP Menopausal hormone therapy: none        OB History    Gravida  3   Para  2   Term  2   Preterm      AB  1   Living  2     SAB  1   TAB      Ectopic      Multiple      Live Births                 Patient Active Problem List   Diagnosis Date Noted  . Hypertension 05/09/2012    Past Medical History:  Diagnosis Date  . Hyperlipemia   . Hypertension   . Reflux     Past Surgical History:  Procedure Laterality Date  . lymph node removed      Current Outpatient Medications  Medication Sig Dispense Refill  . aspirin 81 MG tablet Take 81 mg by mouth daily.    . Cholecalciferol (VITAMIN D PO) Take by mouth.    . irbesartan (AVAPRO) 300 MG tablet Take 300 mg by mouth at bedtime.    Marland Kitchen MAGNESIUM PO Take by mouth.    . Multiple Vitamin (MULTIVITAMIN) tablet Take 1 tablet by mouth daily.    . Omega-3 Fatty Acids (FISH OIL PO) Take by mouth.    . rosuvastatin (CRESTOR) 20 MG tablet Take 1 tablet by mouth daily.    . TURMERIC PO Take by mouth.     No current facility-administered medications for this visit.      ALLERGIES: Advil [ibuprofen]  Family History  Problem Relation Age of Onset  . Hypertension Mother   . Diabetes Father   . Stroke Father   . Cancer Maternal Grandmother        UTERINE    Social History   Socioeconomic History  . Marital status: Married    Spouse name: Not on file  . Number of children: Not on file  . Years of education: Not on file  . Highest education level: Not on file  Occupational History  . Not on file  Social Needs  . Financial resource strain: Not on file   . Food insecurity    Worry: Not on file    Inability: Not on file  . Transportation needs    Medical: Not on file    Non-medical: Not on file  Tobacco Use  . Smoking status: Never Smoker  . Smokeless tobacco: Never Used  Substance and Sexual Activity  . Alcohol use: No    Alcohol/week: 0.0 standard drinks  . Drug use: No  . Sexual activity: Yes    Birth control/protection: Post-menopausal    Comment: VASECTOMY-1st intercourse 35 yo-1 partner  Lifestyle  . Physical activity    Days per week: Not on file    Minutes per session: Not on file  . Stress: Not on file  Relationships  . Social Herbalist on phone: Not on file    Gets together: Not on file    Attends religious service: Not on file    Active member of club or organization: Not on file  Attends meetings of clubs or organizations: Not on file    Relationship status: Not on file  . Intimate partner violence    Fear of current or ex partner: Not on file    Emotionally abused: Not on file    Physically abused: Not on file    Forced sexual activity: Not on file  Other Topics Concern  . Not on file  Social History Narrative  . Not on file    Review of Systems  Constitutional: Negative.   HENT: Negative.   Eyes: Negative.   Respiratory: Negative.   Cardiovascular: Negative.   Gastrointestinal: Negative.   Genitourinary: Negative.   Musculoskeletal: Negative.   Skin: Negative.   Neurological: Negative.   Endo/Heme/Allergies: Negative.   Psychiatric/Behavioral: Negative.     PHYSICAL EXAMINATION:    BP 122/78 (BP Location: Right Arm, Patient Position: Sitting, Cuff Size: Normal)   Pulse 80   Temp (!) 97.2 F (36.2 C) (Skin)   Wt 175 lb 9.6 oz (79.7 kg)   LMP 04/08/2019 (Exact Date)   BMI 33.18 kg/m     General appearance: alert, cooperative and appears stated age  Pelvic: External genitalia:  no lesions              Urethra:  normal appearing urethra with no masses, tenderness or lesions               Bartholins and Skenes: normal                 Vagina: normal appearing vagina with normal color and discharge, no lesions              Cervix:  no lesions  Sonohysterogram The procedure and risks of the procedure were reviewed with the patient, consent form was signed. A speculum was placed in the vagina and the cervix was cleansed with betadine. The sonohysterogram catheter was inserted into the uterine cavity without difficulty. Saline was infused under direct observation with the ultrasound. No intracavitary defects were noted.The catheter was removed.     Chaperone was present for exam.  ASSESSMENT Postmenopausal bleeding, normal pap, atrophic endometrial biopsy, negative sonohysterogram    PLAN Call with recurrent bleeding.    An After Visit Summary was printed and given to the patient.  ~15 minutes face to face time of which over 50% was spent in counseling.

## 2019-05-25 ENCOUNTER — Other Ambulatory Visit: Payer: BC Managed Care – PPO

## 2019-05-25 ENCOUNTER — Other Ambulatory Visit: Payer: Self-pay

## 2019-05-25 ENCOUNTER — Ambulatory Visit (INDEPENDENT_AMBULATORY_CARE_PROVIDER_SITE_OTHER): Payer: BC Managed Care – PPO

## 2019-05-25 ENCOUNTER — Encounter: Payer: Self-pay | Admitting: Obstetrics and Gynecology

## 2019-05-25 ENCOUNTER — Ambulatory Visit (INDEPENDENT_AMBULATORY_CARE_PROVIDER_SITE_OTHER): Payer: BC Managed Care – PPO | Admitting: Obstetrics and Gynecology

## 2019-05-25 ENCOUNTER — Ambulatory Visit: Payer: BC Managed Care – PPO | Admitting: Gynecology

## 2019-05-25 VITALS — BP 122/78 | HR 80 | Temp 97.2°F | Wt 175.6 lb

## 2019-05-25 DIAGNOSIS — N950 Postmenopausal bleeding: Secondary | ICD-10-CM

## 2019-05-30 ENCOUNTER — Other Ambulatory Visit: Payer: Self-pay | Admitting: Family Medicine

## 2019-05-30 DIAGNOSIS — Z1231 Encounter for screening mammogram for malignant neoplasm of breast: Secondary | ICD-10-CM

## 2019-07-24 ENCOUNTER — Other Ambulatory Visit: Payer: Self-pay

## 2019-07-24 ENCOUNTER — Ambulatory Visit
Admission: RE | Admit: 2019-07-24 | Discharge: 2019-07-24 | Disposition: A | Discharge status: Home / Self Care | Payer: BC Managed Care – PPO | Source: Ambulatory Visit | Attending: Family Medicine | Admitting: Family Medicine

## 2019-07-24 DIAGNOSIS — Z1231 Encounter for screening mammogram for malignant neoplasm of breast: Secondary | ICD-10-CM

## 2019-08-08 DIAGNOSIS — K219 Gastro-esophageal reflux disease without esophagitis: Secondary | ICD-10-CM | POA: Insufficient documentation

## 2020-04-09 NOTE — Progress Notes (Signed)
54 y.o. [id number] Married White or Caucasian Not Hispanic or Latino female here for annual exam.   Negative evaluation for PMP bleeding last year.  No further bleeding. Occasional urge incontinence with a full bladder, no change, tolerable.  No bowel c/o.  No dyspareunia.     Patient's last menstrual period was 04/08/2019 (exact date).          Sexually active: Yes.    The current method of family planning is post menopausal status.    Exercising: Yes.    Walking Smoker:  no  Health Maintenance: Pap:05/08/19  WNL Hr HPV Neg, 04/16/2017 WNL NEG HPV History of abnormal Pap:  no MMG:  07/24/19 Density B Bi-rads 1 neg  BMD:   Never  Colonoscopy: 06/2019 f/u 7 years  TDaP:  05/08/19  Gardasil: N/A  reports that she has never smoked. She has never used smokeless tobacco. She reports that she does not drink alcohol and does not use drugs. 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 Son is 18 at [employer]. Daughter is 20, senior at [employer], wants to go to law school. Husband works in Magazine features editor.   Past Medical History:  Diagnosis Date  . Hyperlipemia   . Hypertension   . Reflux     Past Surgical History:  Procedure Laterality Date  . lymph node removed      Current Outpatient Medications  Medication Sig Dispense Refill  . aspirin 81 MG tablet Take 81 mg by mouth daily.    . Cholecalciferol (VITAMIN D PO) Take by mouth.    . Cinnamon 500 MG TABS Take by mouth.    . famotidine (PEPCID) 20 MG tablet Take by mouth.    . irbesartan (AVAPRO) 300 MG tablet Take 300 mg by mouth at bedtime.    Marland Kitchen MAGNESIUM PO Take by mouth.    . Multiple Vitamin (MULTIVITAMIN) tablet Take 1 tablet by mouth daily.    . Omega-3 Fatty Acids (FISH OIL PO) Take by mouth.    . Probiotic Product (PROBIOTIC PO) Take by mouth.    . rosuvastatin (CRESTOR) 20 MG tablet Take 1 tablet by mouth daily.    . TURMERIC PO Take by mouth.     No current  facility-administered medications for this visit.    Family History  Problem Relation Age of Onset  . Hypertension Mother   . Diabetes Father   . Stroke Father   . Cancer Maternal Grandmother        UTERINE    Review of Systems  All other systems reviewed and are negative.   Exam:   BP 112/60   Pulse 94   Ht 5' (1.524 m)   Wt 176 lb 12.8 oz (80.2 kg)   LMP 04/08/2019 (Exact Date)   SpO2 96%   BMI 34.53 kg/m   Weight change: @WEIGHTCHANGE @ Height:   Height: 5' (152.4 cm)  Ht Readings from Last 3 Encounters:  04/11/20 5' (1.524 m)  05/08/19 5\' 1"  (1.549 m)  04/07/19 5' (1.524 m)    General appearance: alert, cooperative and appears stated age Head: Normocephalic, without obvious abnormality, atraumatic Neck: no adenopathy, supple, symmetrical, trachea midline and thyroid normal to inspection and palpation Lungs: clear to auscultation bilaterally Cardiovascular: regular rate and rhythm Breasts: normal appearance, no masses or tenderness Abdomen: soft, non-tender; non distended,  no masses,  no organomegaly Extremities: extremities normal, atraumatic, no cyanosis or edema Skin: Skin color, texture,  turgor normal. No rashes or lesions Lymph nodes: Cervical, supraclavicular, and axillary nodes normal. No abnormal inguinal nodes palpated Neurologic: Grossly normal   Pelvic: External genitalia:  no lesions              Urethra:  normal appearing urethra with no masses, tenderness or lesions              Bartholins and Skenes: normal                 Vagina: normal appearing vagina with normal color and discharge, no lesions              Cervix: no lesions               Bimanual Exam:  Uterus:  normal size, contour, position, consistency, mobility, non-tender              Adnexa: no mass, fullness, tenderness               Rectovaginal: Confirms               Anus:  normal sphincter tone, no lesions  Carolynn Serve chaperoned for the exam.  A:  Well Woman with normal  exam  PMP  P:   Pap UTD  Mammogram due in 12/21  Colonoscopy UTD  Discussed breast self exam  Discussed calcium and vit D intake  Labs UTD with primary

## 2020-04-11 ENCOUNTER — Ambulatory Visit: Payer: BC Managed Care – PPO | Admitting: Obstetrics and Gynecology

## 2020-04-11 ENCOUNTER — Encounter: Payer: Self-pay | Admitting: Obstetrics and Gynecology

## 2020-04-11 ENCOUNTER — Other Ambulatory Visit: Payer: Self-pay

## 2020-04-11 VITALS — BP 112/60 | HR 94 | Ht 60.0 in | Wt 176.8 lb

## 2020-04-11 DIAGNOSIS — Z01419 Encounter for gynecological examination (general) (routine) without abnormal findings: Secondary | ICD-10-CM

## 2020-04-11 NOTE — Patient Instructions (Signed)

## 2020-05-17 ENCOUNTER — Other Ambulatory Visit: Payer: Self-pay | Admitting: Oncology

## 2020-09-13 ENCOUNTER — Other Ambulatory Visit: Payer: Self-pay | Admitting: Family Medicine

## 2020-09-13 DIAGNOSIS — Z1231 Encounter for screening mammogram for malignant neoplasm of breast: Secondary | ICD-10-CM

## 2020-11-08 ENCOUNTER — Ambulatory Visit
Admission: RE | Admit: 2020-11-08 | Discharge: 2020-11-08 | Disposition: A | Discharge status: Home / Self Care | Payer: BC Managed Care – PPO | Source: Ambulatory Visit | Attending: Family Medicine | Admitting: Family Medicine

## 2020-11-08 ENCOUNTER — Other Ambulatory Visit: Payer: Self-pay

## 2020-11-08 ENCOUNTER — Inpatient Hospital Stay: Admission: RE | Admit: 2020-11-08 | Payer: BC Managed Care – PPO | Source: Ambulatory Visit

## 2020-11-08 DIAGNOSIS — Z1231 Encounter for screening mammogram for malignant neoplasm of breast: Secondary | ICD-10-CM

## 2021-04-16 NOTE — Progress Notes (Deleted)
55 y.o. [id number] Married White or Caucasian Not Hispanic or Latino female here for annual exam.      Patient's last menstrual period was 04/08/2019 (exact date).          Sexually active: {yes no:314532}  The current method of family planning is {contraception:315051}.    Exercising: {yes no:314532}  {types:19826} Smoker:  {YES [id number]  Health Maintenance: Pap:  05/08/19  WNL Hr HPV Neg, 04/16/2017 WNL NEG HPV History of abnormal Pap:  no MMG:   11/08/20 density B Bi-rads 1 neg  BMD:   never  Colonoscopy: 06/2019 f/u 7 years  TDaP:  05/08/19  Gardasil: n/a   reports that she has never smoked. She has never used smokeless tobacco. She reports that she does not drink alcohol and does not use drugs.  Past Medical History:  Diagnosis Date   Hyperlipemia    Hypertension    Reflux     Past Surgical History:  Procedure Laterality Date   lymph node removed      Current Outpatient Medications  Medication Sig Dispense Refill   aspirin 81 MG tablet Take 81 mg by mouth daily.     Cholecalciferol (VITAMIN D PO) Take by mouth.     Cinnamon 500 MG TABS Take by mouth.     famotidine (PEPCID) 20 MG tablet Take by mouth.     irbesartan (AVAPRO) 300 MG tablet Take 300 mg by mouth at bedtime.     MAGNESIUM PO Take by mouth.     Multiple Vitamin (MULTIVITAMIN) tablet Take 1 tablet by mouth daily.     Omega-3 Fatty Acids (FISH OIL PO) Take by mouth.     Probiotic Product (PROBIOTIC PO) Take by mouth.     rosuvastatin (CRESTOR) 20 MG tablet Take 1 tablet by mouth daily.     TURMERIC PO Take by mouth.     No current facility-administered medications for this visit.    Family History  Problem Relation Age of Onset   Hypertension Mother    Diabetes Father    Stroke Father    Cancer Maternal Grandmother        UTERINE    Review of Systems  Exam:   LMP 04/08/2019 (Exact Date)   Weight change: @WEIGHTCHANGE @ Height:      Ht Readings from Last 3 Encounters:  04/11/20 5' (1.524 m)   05/08/19 5\' 1"  (1.549 m)  04/07/19 5' (1.524 m)    General appearance: alert, cooperative and appears stated age Head: Normocephalic, without obvious abnormality, atraumatic Neck: no adenopathy, supple, symmetrical, trachea midline and thyroid {CHL AMB PHY EX THYROID NORM DEFAULT:[phone]::"normal to inspection and palpation"} Lungs: clear to auscultation bilaterally Cardiovascular: regular rate and rhythm Breasts: {Exam; breast:13139::"normal appearance, no masses or tenderness"} Abdomen: soft, non-tender; non distended,  no masses,  no organomegaly Extremities: extremities normal, atraumatic, no cyanosis or edema Skin: Skin color, texture, turgor normal. No rashes or lesions Lymph nodes: Cervical, supraclavicular, and axillary nodes normal. No abnormal inguinal nodes palpated Neurologic: Grossly normal   Pelvic: External genitalia:  no lesions              Urethra:  normal appearing urethra with no masses, tenderness or lesions              Bartholins and Skenes: normal                 Vagina: normal appearing vagina with normal color and discharge, no lesions  Cervix: {CHL AMB PHY EX CERVIX NORM DEFAULT:[phone]::"no lesions"}               Bimanual Exam:  Uterus:  {CHL AMB PHY EX UTERUS NORM [phone]::"normal size, contour, position, consistency, mobility, non-tender"}              Adnexa: {CHL AMB PHY EX ADNEXA NO MASS DEFAULT:[phone]::"no mass, fullness, tenderness"}               Rectovaginal: Confirms               Anus:  normal sphincter tone, no lesions  *** chaperoned for the exam.  A:  Well Woman with normal exam  P:

## 2021-04-21 ENCOUNTER — Ambulatory Visit: Payer: BC Managed Care – PPO | Admitting: Obstetrics and Gynecology

## 2021-10-02 ENCOUNTER — Other Ambulatory Visit: Payer: Self-pay | Admitting: Family Medicine

## 2021-10-02 DIAGNOSIS — Z1231 Encounter for screening mammogram for malignant neoplasm of breast: Secondary | ICD-10-CM

## 2021-10-06 ENCOUNTER — Other Ambulatory Visit: Payer: Self-pay | Admitting: Family Medicine

## 2021-10-07 ENCOUNTER — Other Ambulatory Visit: Payer: Self-pay | Admitting: Family Medicine

## 2021-10-07 DIAGNOSIS — R591 Generalized enlarged lymph nodes: Secondary | ICD-10-CM

## 2021-10-08 ENCOUNTER — Ambulatory Visit
Admission: RE | Admit: 2021-10-08 | Discharge: 2021-10-08 | Disposition: A | Discharge status: Home / Self Care | Payer: BC Managed Care – PPO | Source: Ambulatory Visit | Attending: Family Medicine | Admitting: Family Medicine

## 2021-10-08 DIAGNOSIS — R591 Generalized enlarged lymph nodes: Secondary | ICD-10-CM

## 2021-10-08 MED ORDER — IOPAMIDOL (ISOVUE-300) INJECTION 61%
75.0000 mL | Freq: Once | INTRAVENOUS | Status: AC | PRN
  Administered 2021-10-08: 75 mL via INTRAVENOUS

## 2021-11-04 NOTE — Progress Notes (Addendum)
56 y.o. [id number] Married White or Caucasian Not Hispanic or Latino female here for annual exam.  No vaginal bleeding. No dyspareunia.  ? ?She has a h/o urge incontinence, but it is better.  ?Normal bowel movement ? ?She had lymph nodes bx years ago that was benign. She recently had a ct of her neck and chest that was negative, her lab work was normal and the nodes have gotten smaller.  ? ?She recently started [facility] for weight loss. .  ? ?She is not having any hot flashes.  ?  ? ?Patient's last menstrual period was 04/08/2019 (exact date).          ?Sexually active: Yes.    ?The current method of family planning is post menopausal status.    ?Exercising: Yes.    Gym/ health club routine includes cardio. ?Smoker:  no ? ?Health Maintenance: ?Pap:  05/08/19  WNL Hr HPV Neg: 04/16/2017 WNL NEG HPV ?History of abnormal Pap:  no ?MMG:  11/11/20 density B Bi-rads 1 neg, scheduled.   ?BMD:   n/a ?Colonoscopy: 06/2019 f/u 7 years  ?TDaP:  05/08/19  ?Gardasil: n/a ? ? reports that she has never smoked. She has never used smokeless tobacco. She reports that she does not drink alcohol and does not use drugs. 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 Son is a Medical laboratory scientific officer at [employer].  ? ?Past Medical History:  ?Diagnosis Date  ? Hyperlipemia   ? Hypertension   ? Reflux   ? ? ?Past Surgical History:  ?Procedure Laterality Date  ? lymph node removed    ? ? ?Current Outpatient Medications  ?Medication Sig Dispense Refill  ? aspirin 81 MG tablet Take 81 mg by mouth daily.    ? Cholecalciferol (VITAMIN D PO) Take by mouth.    ? Cinnamon 500 MG TABS Take by mouth.    ? famotidine (PEPCID) 20 MG tablet Take by mouth.    ? irbesartan (AVAPRO) 300 MG tablet Take 300 mg by mouth at bedtime.    ? MAGNESIUM PO Take by mouth.    ? Multiple Vitamin (MULTIVITAMIN) tablet Take 1 tablet by mouth daily.    ? Omega-3 Fatty Acids (FISH OIL PO) Take by mouth.    ? rosuvastatin (CRESTOR) 20 MG tablet Take 1  tablet by mouth daily.    ? Semaglutide-Weight Management (WEGOVY) 0.25 MG/0.5ML SOAJ Inject into the skin.    ? TURMERIC PO Take by mouth.    ? ?No current facility-administered medications for this visit.  ? ? ?Family History  ?Problem Relation Age of Onset  ? Hypertension Mother   ? Diabetes Father   ? Stroke Father   ? Cancer Maternal Grandmother   ?     UTERINE  ? ? ?Review of Systems ? ?Exam:   ?BP 128/72   Pulse 88   Ht 5' (1.524 m)   Wt 177 lb (80.3 kg)   LMP 04/08/2019 (Exact Date)   SpO2 98%   BMI 34.57 kg/m?   Weight change: @WEIGHTCHANGE @ Height:   Height: 5' (152.4 cm)  ?Ht Readings from Last 3 Encounters:  ?11/10/21 5' (1.524 m)  ?04/11/20 5' (1.524 m)  ?05/08/19 5\' 1"  (1.549 m)  ? ? ?General appearance: alert, cooperative and appears stated age ?Head: Normocephalic, without obvious abnormality, atraumatic ?Neck: enlarged mobile post clavicular node on the left (smaller per patient), supple, symmetrical, trachea midline and thyroid normal to inspection and palpation ?Lungs:  clear to auscultation bilaterally ?Cardiovascular: regular rate and rhythm ?Breasts: normal appearance, no masses or tenderness ?Abdomen: soft, non-tender; non distended,  no masses,  no organomegaly ?Extremities: extremities normal, atraumatic, no cyanosis or edema ?Skin: Skin color, texture, turgor normal. No rashes or lesions ?Lymph nodes: Cervical, supraclavicular, and axillary nodes normal. ?No abnormal inguinal nodes palpated ?Neurologic: Grossly normal ? ? ?Pelvic: External genitalia:  no lesions ?             Urethra:  normal appearing urethra with no masses, tenderness or lesions ?             Bartholins and Skenes: normal    ?             Vagina: normal appearing vagina with normal color and discharge, no lesions ?             Cervix: no lesions ?              ?Bimanual Exam:  Uterus:   no masses or tenderness ?             Adnexa: no mass, fullness, tenderness ?              Rectovaginal: Confirms ?               Anus:  normal sphincter tone, no lesions ? ?Carolynn Serve chaperoned for the exam. ? ?1. Well woman exam ?Discussed breast self exam ?Discussed calcium and vit D intake ?No pap this year ?Colonoscopy UTD ? ?Addendum: ?Mammogram scheduled ?Labs with primary ? ?

## 2021-11-10 ENCOUNTER — Encounter: Payer: Self-pay | Admitting: Obstetrics and Gynecology

## 2021-11-10 ENCOUNTER — Ambulatory Visit (INDEPENDENT_AMBULATORY_CARE_PROVIDER_SITE_OTHER): Payer: BC Managed Care – PPO | Admitting: Obstetrics and Gynecology

## 2021-11-10 VITALS — BP 128/72 | HR 88 | Ht 60.0 in | Wt 177.0 lb

## 2021-11-10 DIAGNOSIS — Z01419 Encounter for gynecological examination (general) (routine) without abnormal findings: Secondary | ICD-10-CM

## 2021-11-10 DIAGNOSIS — Z860100 Personal history of colon polyps, unspecified: Secondary | ICD-10-CM | POA: Insufficient documentation

## 2021-11-10 NOTE — Patient Instructions (Signed)

## 2021-11-14 ENCOUNTER — Ambulatory Visit
Admission: RE | Admit: 2021-11-14 | Discharge: 2021-11-14 | Disposition: A | Discharge status: Home / Self Care | Payer: BC Managed Care – PPO | Source: Ambulatory Visit | Attending: Family Medicine | Admitting: Family Medicine

## 2021-11-14 DIAGNOSIS — Z1231 Encounter for screening mammogram for malignant neoplasm of breast: Secondary | ICD-10-CM

## 2022-01-05 ENCOUNTER — Ambulatory Visit: Payer: BC Managed Care – PPO | Admitting: Obstetrics and Gynecology

## 2022-01-05 ENCOUNTER — Encounter: Payer: Self-pay | Admitting: Obstetrics and Gynecology

## 2022-01-05 ENCOUNTER — Other Ambulatory Visit (HOSPITAL_COMMUNITY)
Admission: RE | Admit: 2022-01-05 | Discharge: 2022-01-05 | Disposition: A | Discharge status: Home / Self Care | Payer: BC Managed Care – PPO | Source: Ambulatory Visit | Attending: Obstetrics and Gynecology | Admitting: Obstetrics and Gynecology

## 2022-01-05 ENCOUNTER — Telehealth: Payer: Self-pay | Admitting: *Deleted

## 2022-01-05 VITALS — BP 134/68 | HR 66 | Wt 170.0 lb

## 2022-01-05 DIAGNOSIS — N950 Postmenopausal bleeding: Secondary | ICD-10-CM

## 2022-01-05 DIAGNOSIS — N888 Other specified noninflammatory disorders of cervix uteri: Secondary | ICD-10-CM | POA: Diagnosis not present

## 2022-01-05 DIAGNOSIS — Z124 Encounter for screening for malignant neoplasm of cervix: Secondary | ICD-10-CM | POA: Insufficient documentation

## 2022-01-05 NOTE — Patient Instructions (Signed)

## 2022-01-05 NOTE — Telephone Encounter (Signed)
Dr.Jertson please see below.  Okay to send to [facility]?

## 2022-01-05 NOTE — Progress Notes (Signed)
GYNECOLOGY  VISIT   HPI: 56 y.o.   Married White or Caucasian Not Hispanic or Latino  female   [phone] with Patient's last menstrual period was 04/08/2019 (exact date).   here for post menopausal bleeding. She had about two days of pink discharge last week. She had cramping and a headache in association with the spotting, similar to when she had cycles.  No intercourse or straining prior to the spotting.  Prior negative evaluation for PMP bleeding in 2020. Negative sonohysterogram in 10/20, endometrial biopsy with atrophy.   Last pap was in 10/20, normal, neg hpv  She has been started on metformin for hyperglycemia.   GYNECOLOGIC HISTORY: Patient's last menstrual period was 04/08/2019 (exact date). Contraception:PMP Menopausal hormone therapy: No        OB History     Gravida  3   Para  2   Term  2   Preterm      AB  1   Living  2      SAB  1   IAB      Ectopic      Multiple      Live Births                 Patient Active Problem List   Diagnosis Date Noted   Personal history of colonic polyps 11/10/2021   Gastroesophageal reflux disease without esophagitis 08/08/2019   Hypertension 05/09/2012    Past Medical History:  Diagnosis Date   Hyperlipemia    Hypertension    Reflux     Past Surgical History:  Procedure Laterality Date   lymph node removed      Current Outpatient Medications  Medication Sig Dispense Refill   aspirin 81 MG tablet Take 81 mg by mouth daily.     Cholecalciferol (VITAMIN D PO) Take by mouth.     Cinnamon 500 MG TABS Take by mouth.     famotidine (PEPCID) 20 MG tablet Take by mouth.     irbesartan (AVAPRO) 300 MG tablet Take 300 mg by mouth at bedtime.     MAGNESIUM PO Take by mouth.     METFORMIN HCL ER PO Take 500 mg by mouth.     Multiple Vitamin (MULTIVITAMIN) tablet Take 1 tablet by mouth daily.     Omega-3 Fatty Acids (FISH OIL PO) Take by mouth.     rosuvastatin (CRESTOR) 20 MG tablet Take 1 tablet by mouth  daily.     Semaglutide-Weight Management (WEGOVY) 0.25 MG/0.5ML SOAJ Inject into the skin.     TURMERIC PO Take by mouth.     No current facility-administered medications for this visit.     ALLERGIES: Advil [ibuprofen]  Family History  Problem Relation Age of Onset   Hypertension Mother    Diabetes Father    Stroke Father    Cancer Maternal Grandmother        UTERINE    Social History   Socioeconomic History   Marital status: Married    Spouse name: Not on file   Number of children: Not on file   Years of education: Not on file   Highest education level: Not on file  Occupational History   Not on file  Tobacco Use   Smoking status: Never   Smokeless tobacco: Never  Vaping Use   Vaping Use: Never used  Substance and Sexual Activity   Alcohol use: No    Alcohol/week: 0.0 standard drinks of alcohol   Drug use: No  Sexual activity: Yes    Birth control/protection: Post-menopausal    Comment: VASECTOMY-1st intercourse 57 yo-1 partner  Other Topics Concern   Not on file  Social History Narrative   Not on file   Social Determinants of Health   Financial Resource Strain: Not on file  Food Insecurity: Not on file  Transportation Needs: Not on file  Physical Activity: Not on file  Stress: Not on file  Social Connections: Not on file  Intimate Partner Violence: Not on file    Review of Systems  All other systems reviewed and are negative.   PHYSICAL EXAMINATION:    BP 134/68   Pulse 66   Wt 170 lb (77.1 kg)   LMP 04/08/2019 (Exact Date)   SpO2 99%   BMI 33.20 kg/m     General appearance: alert, cooperative and appears stated age  Pelvic: External genitalia:  no lesions              Urethra:  normal appearing urethra with no masses, tenderness or lesions              Bartholins and Skenes: normal                 Vagina: normal appearing vagina with normal color and discharge, no lesions              Cervix: no lesions and + ectropion, Friable               Bimanual Exam:  Uterus:  normal size, contour, position, consistency, mobility, non-tender              Adnexa: no mass, fullness, tenderness               The risks of endometrial biopsy were reviewed and a consent was obtained.  A speculum was placed in the vagina and the cervix was cleansed with betadine. A tenaculum was placed on the cervix and the pipelle was placed into the endometrial cavity. The uterus sounded to 7 cm. The endometrial biopsy was performed, taking care to get a representative sample, sampling 360 degrees of the uterine cavity. Minimal tissue was obtained. The tenaculum and speculum were removed. There were no complications.   Chaperone was present for exam.  1. Postmenopausal bleeding Spotting, friable cervix - US PELVIS TRANSVAGINAL NON-OB (TV ONLY); Future - Cytology - PAP - Surgical pathology( / [facility])  2. Screening for cervical cancer - Cytology - PAP  3. Friable cervix - Cytology - PAP

## 2022-01-05 NOTE — Telephone Encounter (Signed)
-----   Message from Sinclair Grooms sent at 01/05/2022 10:25 AM EDT ----- Regarding: [place] Patient needs ultrasound I informed her our first available is late July she wants something sooner outside the office.

## 2022-01-05 NOTE — Telephone Encounter (Signed)
We were able to find a spot for her. Thanks

## 2022-01-06 LAB — SURGICAL PATHOLOGY

## 2022-01-07 LAB — CYTOLOGY - PAP
Comment: NEGATIVE
Diagnosis: UNDETERMINED — AB
High risk HPV: NEGATIVE

## 2022-01-08 ENCOUNTER — Other Ambulatory Visit: Payer: BC Managed Care – PPO

## 2022-01-15 ENCOUNTER — Ambulatory Visit (INDEPENDENT_AMBULATORY_CARE_PROVIDER_SITE_OTHER): Payer: BC Managed Care – PPO

## 2022-01-15 DIAGNOSIS — N950 Postmenopausal bleeding: Secondary | ICD-10-CM | POA: Diagnosis not present

## 2022-01-18 ENCOUNTER — Telehealth: Payer: Self-pay | Admitting: Obstetrics and Gynecology

## 2022-01-18 DIAGNOSIS — N950 Postmenopausal bleeding: Secondary | ICD-10-CM

## 2022-01-18 DIAGNOSIS — N83202 Unspecified ovarian cyst, left side: Secondary | ICD-10-CM

## 2022-01-19 MED ORDER — MEDROXYPROGESTERONE ACETATE 5 MG PO TABS: ORAL_TABLET | ORAL | 0 refills | Status: DC

## 2022-02-06 ENCOUNTER — Other Ambulatory Visit: Payer: BC Managed Care – PPO

## 2022-02-06 DIAGNOSIS — N83202 Unspecified ovarian cyst, left side: Secondary | ICD-10-CM

## 2022-02-06 DIAGNOSIS — N950 Postmenopausal bleeding: Secondary | ICD-10-CM

## 2022-02-09 LAB — CA 125: CA 125: 8 U/mL (ref <35)

## 2022-02-09 LAB — FOLLICLE STIMULATING HORMONE: FSH: 61.4 m[IU]/mL

## 2022-02-10 ENCOUNTER — Telehealth: Payer: Self-pay | Admitting: *Deleted

## 2022-02-10 DIAGNOSIS — N83202 Unspecified ovarian cyst, left side: Secondary | ICD-10-CM

## 2022-02-10 NOTE — Telephone Encounter (Signed)
-----   Message from Romualdo Bolk, MD sent at 02/09/2022  5:15 PM EDT ----- Please inform the patient that the CA 125 is normal and the [facility] is in a menopausal range. The [facility] can go up and down, so it means she is either perimenopausal or postmenopausal.  Please see if she has taken the provera yet and if she has had any bleeding? She should also have a f/u ultrasound in mid to late August to f/u on the ovarian cyst.

## 2022-02-10 NOTE — Telephone Encounter (Signed)
Patient informed with below. Patient said she has not taken Provera 5 mg yet,she was waiting to hear the CA 125 results before starting Rx. Patient said she will start medication this week and follow up. She is currently not scheduled for ultrasound yet, will have appointment reach out to schedule.

## 2022-03-18 ENCOUNTER — Other Ambulatory Visit: Payer: BC Managed Care – PPO | Admitting: Obstetrics and Gynecology

## 2022-03-18 ENCOUNTER — Other Ambulatory Visit: Payer: BC Managed Care – PPO

## 2022-03-18 ENCOUNTER — Ambulatory Visit: Payer: BC Managed Care – PPO

## 2022-03-18 DIAGNOSIS — N83202 Unspecified ovarian cyst, left side: Secondary | ICD-10-CM | POA: Diagnosis not present

## 2022-03-19 ENCOUNTER — Telehealth: Payer: Self-pay | Admitting: Obstetrics and Gynecology

## 2022-03-19 NOTE — Telephone Encounter (Signed)
Please let the patient know that her f/u ultrasound is normal.

## 2022-03-20 NOTE — Telephone Encounter (Signed)
Left detailed VM on machine per DPR.  

## 2022-10-14 ENCOUNTER — Other Ambulatory Visit: Payer: Self-pay | Admitting: Family Medicine

## 2022-10-14 DIAGNOSIS — Z1231 Encounter for screening mammogram for malignant neoplasm of breast: Secondary | ICD-10-CM

## 2022-12-11 ENCOUNTER — Ambulatory Visit
Admission: RE | Admit: 2022-12-11 | Discharge: 2022-12-11 | Disposition: A | Discharge status: Home / Self Care | Payer: BC Managed Care – PPO | Source: Ambulatory Visit | Attending: Family Medicine | Admitting: Family Medicine

## 2022-12-11 DIAGNOSIS — Z1231 Encounter for screening mammogram for malignant neoplasm of breast: Secondary | ICD-10-CM

## 2022-12-22 NOTE — Progress Notes (Signed)
57 y.o. [id number] Married White or Caucasian Not Hispanic or Latino female here for annual exam.  No vaginal bleeding. Having occasional hot flashes.   Last HgbA1C was 8.4%.   Mom died this year. Dad has early dementia. She has had tremendous stress this year.   Patient's last menstrual period was 04/08/2019 (exact date).          Sexually active: Yes.    The current method of family planning is post menopausal status.    Exercising: No.   Nothing extra since mother passed in 07/2022. Smoker: no  Health Maintenance: Pap: 01/05/2022-ASCUS, HPV- neg, 05/08/2019-WNL, HPV- neg, 04/16/2017-WNL, HPV- neg History of abnormal Pap: no MMG: 12/11/2022- neg birads 1 BMD: never Colonoscopy: 06/2019, Due 06/2026 TDaP: 05/08/2019 Gardasil: never   reports that she has never smoked. She has never used smokeless tobacco. She reports that she does not drink alcohol and does not use drugs. She is a NP, works at [employer]. Daughter finishing her second year of law school. Son is a Holiday representative at [employer].  Past Medical History:  Diagnosis Date   History of type 2 diabetes mellitus    Hyperlipemia    Hypertension    Reflux     Past Surgical History:  Procedure Laterality Date   lymph node removed      Current Outpatient Medications  Medication Sig Dispense Refill   aspirin 81 MG tablet Take 81 mg by mouth daily.     Cholecalciferol (VITAMIN D PO) Take by mouth.     Cinnamon 500 MG TABS Take by mouth.     empagliflozin (JARDIANCE) 25 MG TABS tablet Take by mouth.     famotidine (PEPCID) 20 MG tablet Take by mouth.     irbesartan (AVAPRO) 300 MG tablet Take 300 mg by mouth at bedtime.     Magnesium 250 MG TABS 1 tablet with a meal Orally Once a day for 30 day(s)     METFORMIN HCL ER PO Take 500 mg by mouth.     Multiple Vitamin (MULTIVITAMIN) tablet Take 1 tablet by mouth daily.     Omega-3 Fatty Acids (FISH OIL PO) Take by mouth.     OZEMPIC, 2 MG/DOSE, 8 MG/3ML SOPN Inject 2 mg into the skin once a  week.     rosuvastatin (CRESTOR) 20 MG tablet Take 1 tablet by mouth daily.     TURMERIC PO Take by mouth.     No current facility-administered medications for this visit.    Family History  Problem Relation Age of Onset   Hypertension Mother    Diabetes Father    Stroke Father    Cancer Maternal Grandmother        UTERINE    Review of Systems  All other systems reviewed and are negative.   Exam:   BP 106/78   Pulse 91   Ht 5' (1.524 m)   Wt 160 lb (72.6 kg)   LMP 04/08/2019 (Exact Date)   SpO2 96%   BMI 31.25 kg/m   Weight change: @WEIGHTCHANGE @ Height:   Height: 5' (152.4 cm)  Ht Readings from Last 3 Encounters:  12/25/22 5' (1.524 m)  11/10/21 5' (1.524 m)  04/11/20 5' (1.524 m)    General appearance: alert, cooperative and appears stated age Head: Normocephalic, without obvious abnormality, atraumatic Neck: no adenopathy, supple, symmetrical, trachea midline and thyroid normal to inspection and palpation Lungs: clear to auscultation bilaterally Cardiovascular: regular rate and rhythm Breasts: normal appearance, no masses or  tenderness Abdomen: soft, non-tender; non distended,  no masses,  no organomegaly Extremities: extremities normal, atraumatic, no cyanosis or edema Skin: Skin color, texture, turgor normal. No rashes or lesions Lymph nodes: Cervical, supraclavicular, and axillary nodes normal. No abnormal inguinal nodes palpated Neurologic: Grossly normal   Pelvic: External genitalia:  no lesions              Urethra:  normal appearing urethra with no masses, tenderness or lesions              Bartholins and Skenes: normal                 Vagina: normal appearing vagina with normal color and discharge, no lesions              Cervix: nabothian cyst and no lesions               Bimanual Exam:  Uterus:  normal size, contour, position, consistency, mobility, non-tender              Adnexa: no mass, fullness, tenderness               Rectovaginal:  Confirms               Anus:  normal sphincter tone, no lesions  Jodelle Red, CMA chaperoned for the exam.  1. Well woman exam Discussed breast self exam Discussed calcium and vit D intake Mammogram and colonoscopy UTD Labs with primary No pap this year

## 2022-12-25 ENCOUNTER — Encounter: Payer: Self-pay | Admitting: Obstetrics and Gynecology

## 2022-12-25 ENCOUNTER — Ambulatory Visit (INDEPENDENT_AMBULATORY_CARE_PROVIDER_SITE_OTHER): Payer: BC Managed Care – PPO | Admitting: Obstetrics and Gynecology

## 2022-12-25 VITALS — BP 106/78 | HR 91 | Ht 60.0 in | Wt 160.0 lb

## 2022-12-25 DIAGNOSIS — Z01419 Encounter for gynecological examination (general) (routine) without abnormal findings: Secondary | ICD-10-CM | POA: Diagnosis not present

## 2022-12-25 NOTE — Patient Instructions (Signed)

## 2023-04-24 IMAGING — MG MM DIGITAL SCREENING BILAT W/ TOMO AND CAD
8 series · 8 of 24 positions shown · non-contrast
Comparison: Previous exam(s).

CLINICAL DATA: Screening.

EXAM:
DIGITAL SCREENING BILATERAL MAMMOGRAM WITH TOMOSYNTHESIS AND CAD
TECHNIQUE: Bilateral screening digital craniocaudal and mediolateral oblique
mammograms were obtained. Bilateral screening digital breast
tomosynthesis was performed. The images were evaluated with
computer-aided detection.

[R CC synth-2D]
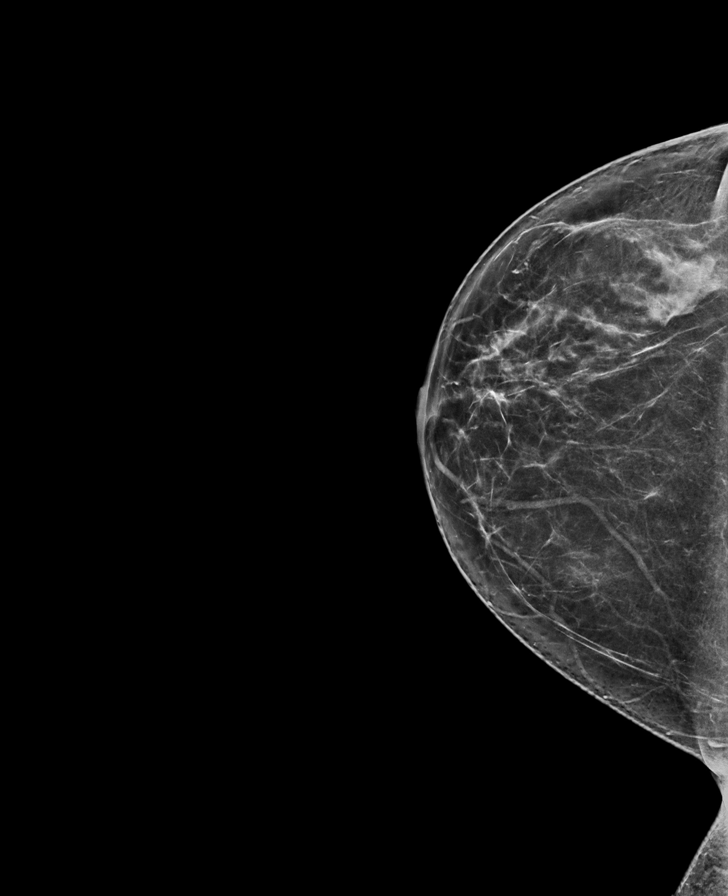

[L MLO synth-2D]
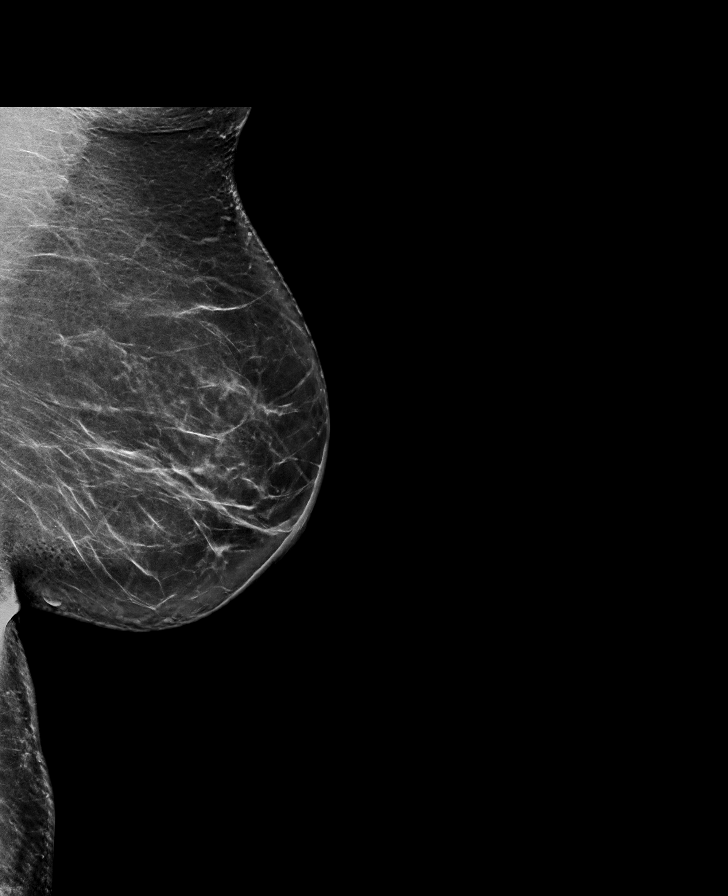

[R MLO synth-2D]
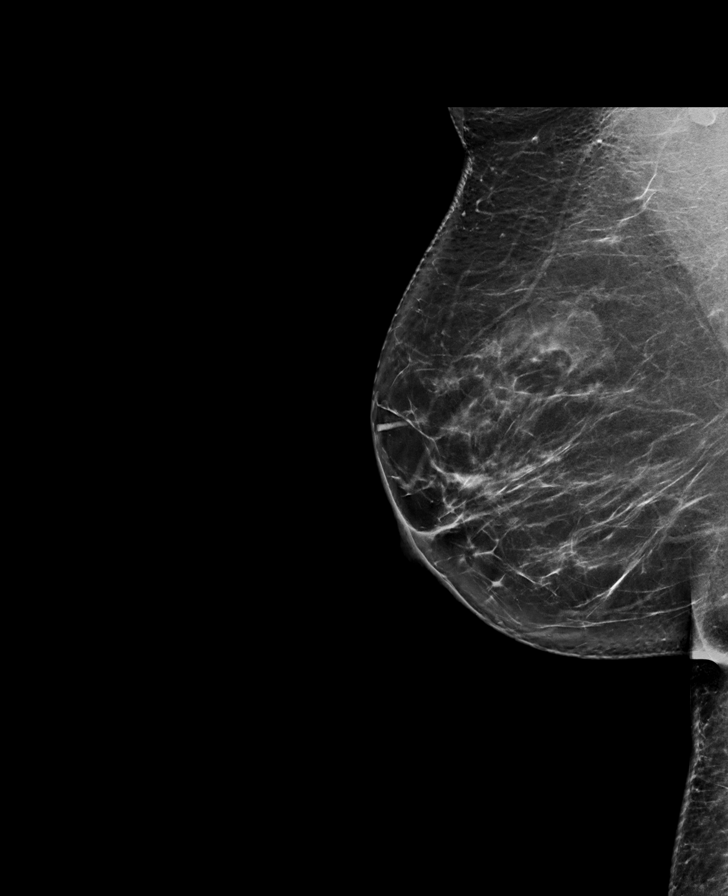

[L CC synth-2D]
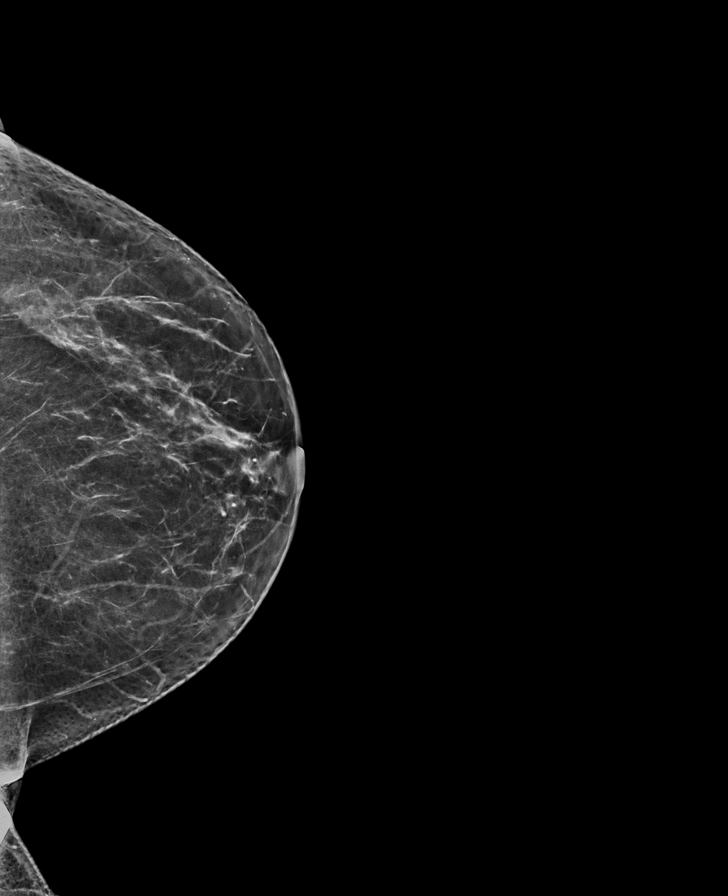

[R CC tomo · tomo slice 39/76.0]
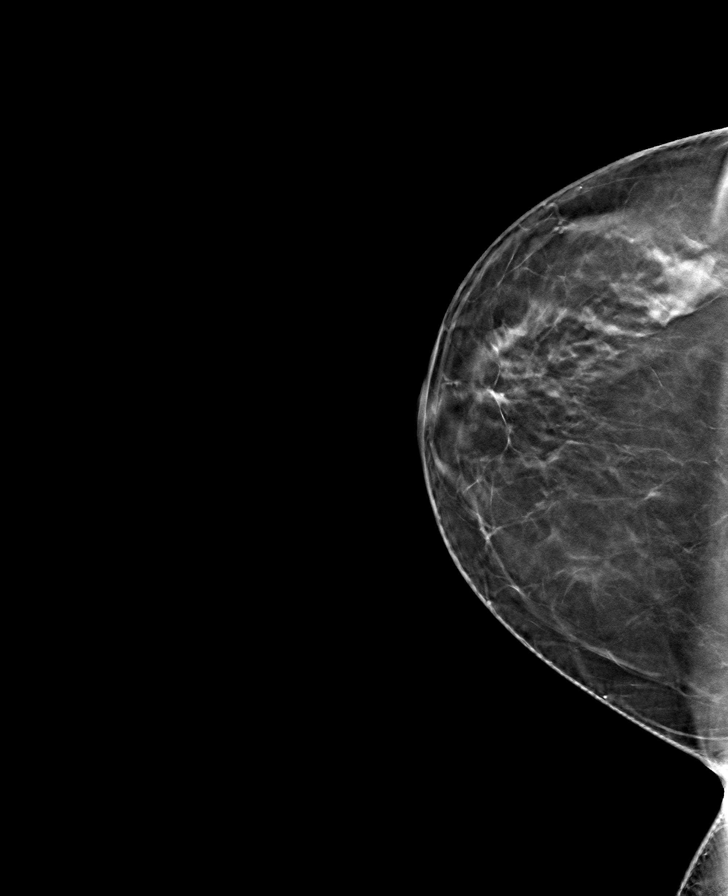

[R MLO tomo · tomo slice 43/85.0]
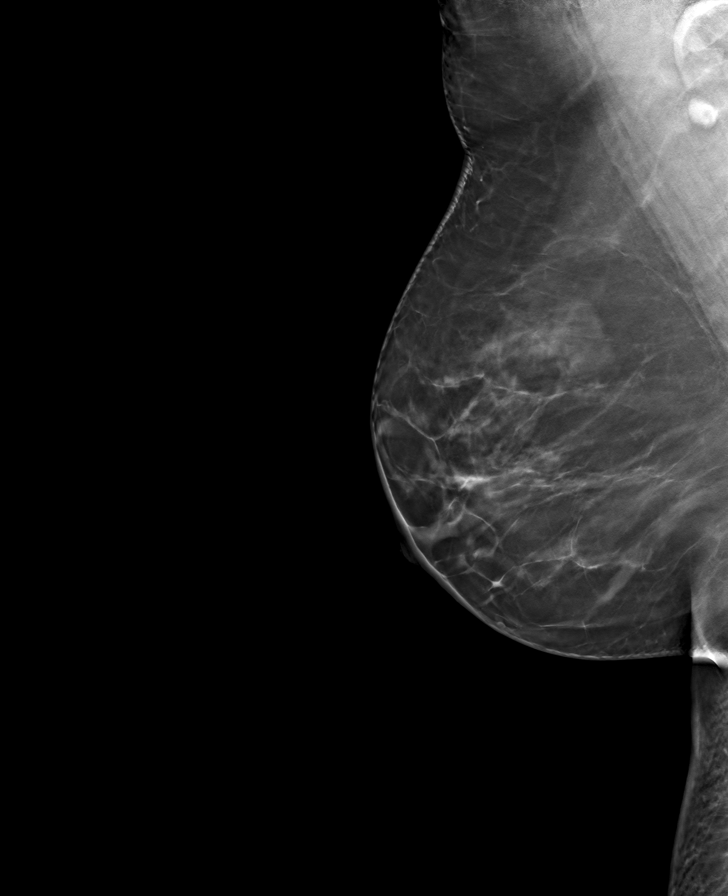

[L CC tomo · tomo slice 35/69.0]
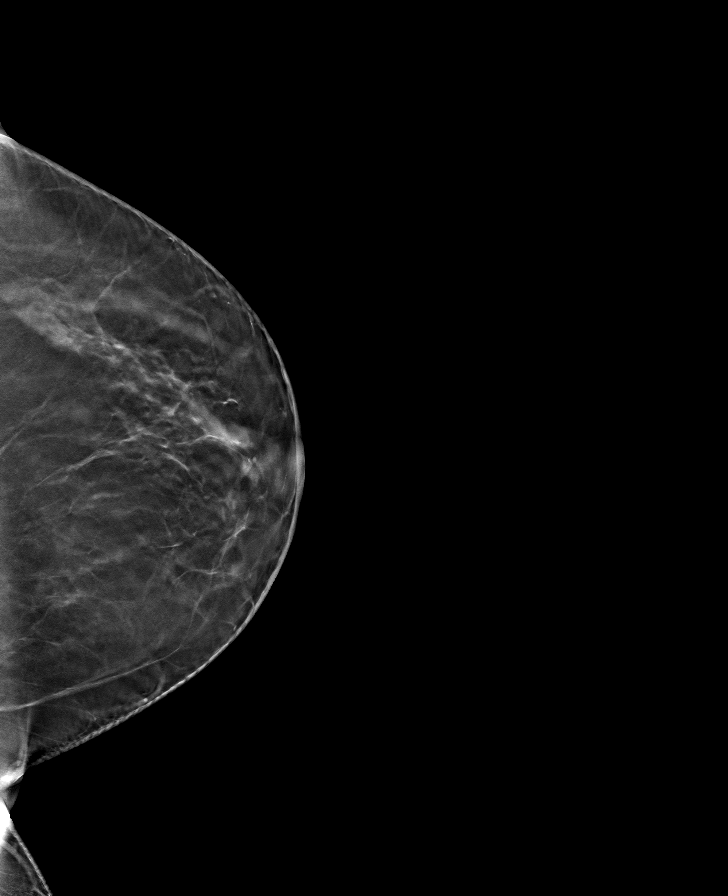

[L MLO tomo · tomo slice 44/87.0]
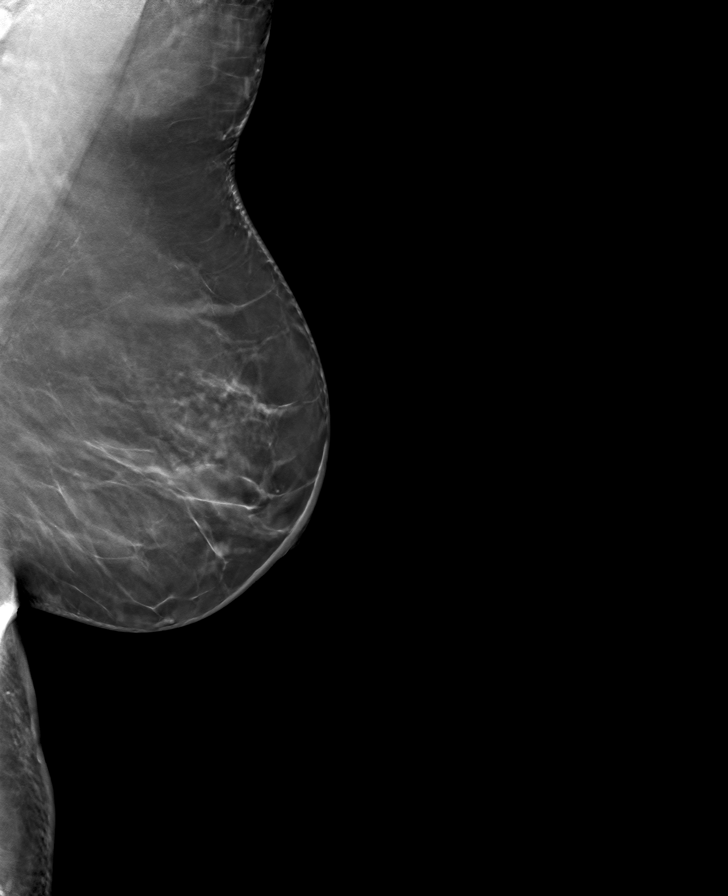

[8 of 24 positions shown; findings below may reference images not displayed]

ACR Breast Density Category b: There are scattered areas of
fibroglandular density.
FINDINGS: There are no findings suspicious for malignancy.
IMPRESSION: No mammographic evidence of malignancy. A result letter of this
screening mammogram will be mailed directly to the patient.

RECOMMENDATION:
Screening mammogram in one year. (Code:51-O-LD2)

BI-RADS CATEGORY  1: Negative.

## 2023-12-24 ENCOUNTER — Other Ambulatory Visit: Payer: Self-pay | Admitting: Adult Health Nurse Practitioner

## 2023-12-24 DIAGNOSIS — Z1231 Encounter for screening mammogram for malignant neoplasm of breast: Secondary | ICD-10-CM

## 2023-12-29 ENCOUNTER — Ambulatory Visit
Admission: RE | Admit: 2023-12-29 | Discharge: 2023-12-29 | Disposition: A | Discharge status: Home / Self Care | Source: Ambulatory Visit | Attending: Adult Health Nurse Practitioner | Admitting: Adult Health Nurse Practitioner

## 2023-12-29 DIAGNOSIS — Z1231 Encounter for screening mammogram for malignant neoplasm of breast: Secondary | ICD-10-CM

## 2024-02-23 ENCOUNTER — Other Ambulatory Visit (HOSPITAL_COMMUNITY)
Admission: RE | Admit: 2024-02-23 | Discharge: 2024-02-23 | Disposition: A | Discharge status: Home / Self Care | Source: Ambulatory Visit | Attending: Obstetrics and Gynecology | Admitting: Obstetrics and Gynecology

## 2024-02-23 ENCOUNTER — Ambulatory Visit (INDEPENDENT_AMBULATORY_CARE_PROVIDER_SITE_OTHER): Admitting: Obstetrics and Gynecology

## 2024-02-23 ENCOUNTER — Encounter: Payer: Self-pay | Admitting: Obstetrics and Gynecology

## 2024-02-23 VITALS — BP 150/78 | HR 89 | Ht 60.0 in | Wt 162.2 lb

## 2024-02-23 DIAGNOSIS — Z01419 Encounter for gynecological examination (general) (routine) without abnormal findings: Secondary | ICD-10-CM | POA: Insufficient documentation

## 2024-02-23 DIAGNOSIS — E2839 Other primary ovarian failure: Secondary | ICD-10-CM

## 2024-02-23 NOTE — Progress Notes (Signed)
 58 y.o. y.o. female here for annual exam. Patient's last menstrual period was 04/08/2019 (exact date).   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 Son lives with her father  [id number] Married Teresa or Caucasian Not Hispanic or Latino female here for annual exam.  No vaginal bleeding. Having occasional hot flashes.    HgbA1C was 8.4%. Dec 24, 2023 7.4   Mom died recently, work is busy, son did not pay college tuition and his classes were dropped. Dad has early dementia. She has had tremendous stress this year.   Patient's last menstrual period was 04/08/2019 (exact date).          Sexually active: Yes.    The current method of family planning is post menopausal status.    Exercising: No.  Nothing extra since mother passed in 07/2022. Smoker: no  Health Maintenance: Pap: 01/05/2022-ASCUS, HPV- neg, 05/08/2019-WNL, HPV- neg, 04/16/2017-WNL, HPV- neg History of abnormal Pap: no MMG: 01/03/24- neg birads 1 BMD: to get baseline. No fractures, mother with osteoporosis Colonoscopy: 06/2019, Due 06/2026 TDaP: 05/08/2019 Gardasil: never  Body mass index is 31.68 kg/m.      No data to display          Blood pressure (!) 150/78, pulse 89, height 5' (1.524 m), weight 162 lb 3.2 oz (73.6 kg), last menstrual period 04/08/2019, SpO2 97%.     Component Value Date/Time   DIAGPAP (A) 01/05/2022 1020    - Atypical squamous cells of undetermined significance (ASC-US )   DIAGPAP  05/08/2019 1548    - Negative for intraepithelial lesion or malignancy (NILM)   HPVHIGH Negative 01/05/2022 1020   HPVHIGH Negative 05/08/2019 1548   ADEQPAP  01/05/2022 1020    Satisfactory for evaluation; transformation zone component PRESENT.   ADEQPAP  05/08/2019 1548    Satisfactory for evaluation; transformation zone component PRESENT.    GYN HISTORY:    Component Value Date/Time   DIAGPAP (A) 01/05/2022 1020    - Atypical squamous cells of undetermined significance (ASC-US )   DIAGPAP  05/08/2019  1548    - Negative for intraepithelial lesion or malignancy (NILM)   HPVHIGH Negative 01/05/2022 1020   HPVHIGH Negative 05/08/2019 1548   ADEQPAP  01/05/2022 1020    Satisfactory for evaluation; transformation zone component PRESENT.   ADEQPAP  05/08/2019 1548    Satisfactory for evaluation; transformation zone component PRESENT.    OB History  Gravida Para Term Preterm AB Living  3 2 2  1 2   SAB IAB Ectopic Multiple Live Births  1        # Outcome Date GA Lbr Len/2nd Weight Sex Type Anes PTL Lv  3 Term           2 Term           1 SAB             Past Medical History:  Diagnosis Date   BCC (basal cell carcinoma), back    History of type 2 diabetes mellitus    Hyperlipemia    Hypertension    Reflux     Past Surgical History:  Procedure Laterality Date   lymph node removed      Current Outpatient Medications on File Prior to Visit  Medication Sig Dispense Refill   aspirin 81 MG tablet Take 81 mg by mouth daily.     Cholecalciferol (VITAMIN D PO) Take by mouth.     Cinnamon 500 MG TABS Take by mouth.  empagliflozin (JARDIANCE) 25 MG TABS tablet Take by mouth.     famotidine (PEPCID) 20 MG tablet Take by mouth.     losartan (COZAAR) 100 MG tablet Take 100 mg by mouth daily.     Magnesium 250 MG TABS 1 tablet with a meal Orally Once a day for 30 day(s)     METFORMIN HCL ER PO Take 500 mg by mouth.     Multiple Vitamin (MULTIVITAMIN) tablet Take 1 tablet by mouth daily.     Omega-3 Fatty Acids (FISH OIL PO) Take by mouth.     OZEMPIC, 2 MG/DOSE, 8 MG/3ML SOPN Inject 2 mg into the skin once a week.     rosuvastatin (CRESTOR) 20 MG tablet Take 1 tablet by mouth daily.     TURMERIC PO Take by mouth.     irbesartan (AVAPRO) 300 MG tablet Take 300 mg by mouth at bedtime. (Patient not taking: Reported on 02/23/2024)     No current facility-administered medications on file prior to visit.    Social History   Socioeconomic History   Marital status: Married    Spouse  name: Not on file   Number of children: Not on file   Years of education: Not on file   Highest education level: Not on file  Occupational History   Not on file  Tobacco Use   Smoking status: Never   Smokeless tobacco: Never  Vaping Use   Vaping status: Never Used  Substance and Sexual Activity   Alcohol use: No    Alcohol/week: 0.0 standard drinks of alcohol   Drug use: No   Sexual activity: Yes    Birth control/protection: Post-menopausal    Comment: VASECTOMY-1st intercourse 58 yo-1 partner  Other Topics Concern   Not on file  Social History Narrative   Not on file   Social Drivers of Health   Financial Resource Strain: Low Risk  (12/10/2023)   Received from [facility]   Overall Financial Resource Strain (CARDIA)    Difficulty of Paying Living Expenses: Not hard at all  Food Insecurity: No Food Insecurity (12/10/2023)   [facility] from [facility]   Hunger Vital Sign    Within the past 12 months, you worried that your food would run out before you got the money to buy more.: Never true    Within the past 12 months, the food you bought just didn't last and you didn't have money to get more.: Never true  Transportation Needs: No Transportation Needs (12/10/2023)   Received from [facility]    Lack of Transportation (Medical): No    Lack of Transportation (Non-Medical): No  Physical Activity: Not on file  Stress: Not on file  Social Connections: Unknown (12/07/2021)   Received from [facility]   Social Network    Social Network: Not on file  Intimate Partner Violence: Unknown (10/29/2021)   Received from [facility]   HITS    Physically Hurt: Not on file    Insult or Talk Down To: Not on file    Threaten Physical Harm: Not on file    Scream or Curse: Not on file    Family History  Problem Relation Age of Onset   Hypertension Mother    Diabetes Father    Stroke Father    Cancer Maternal Grandmother        UTERINE      Allergies  Allergen Reactions   Advil [Ibuprofen] Swelling  Patient's last menstrual period was Patient's last menstrual period was 04/08/2019 (exact date)..           Review of Systems Alls systems reviewed and are negative.     Physical Exam Constitutional:      Appearance: Normal appearance.  Genitourinary:     Vulva and urethral meatus normal.     No lesions in the vagina.     Right Labia: No rash, lesions or skin changes.    Left Labia: No lesions, skin changes or rash.    No vaginal discharge or tenderness.     No vaginal prolapse present.    Mild vaginal atrophy present.     Right Adnexa: not tender, not palpable and no mass present.    Left Adnexa: not tender, not palpable and no mass present.    No cervical motion tenderness or discharge.     Uterus is not enlarged, tender or irregular.  Breasts:    Right: Normal.     Left: Normal.  HENT:     Head: Normocephalic.  Neck:     Thyroid: No thyroid mass, thyromegaly or thyroid tenderness.  Cardiovascular:     Rate and Rhythm: Normal rate and regular rhythm.     Heart sounds: Normal heart sounds, S1 normal and S2 normal.  Pulmonary:     Effort: Pulmonary effort is normal.     Breath sounds: Normal breath sounds and air entry.  Abdominal:     General: There is no distension.     Palpations: Abdomen is soft. There is no mass.     Tenderness: There is no abdominal tenderness. There is no guarding or rebound.  Musculoskeletal:        General: Normal range of motion.     Cervical back: Full passive range of motion without pain, normal range of motion and neck supple. No tenderness.     Right lower leg: No edema.     Left lower leg: No edema.  Neurological:     Mental Status: She is alert.  Skin:    General: Skin is warm.  Psychiatric:        Mood and Affect: Mood normal.        Behavior: Behavior normal.        Thought Content: Thought content normal.  Vitals and nursing note reviewed. Exam  conducted with a chaperone present.    Blood pressure (!) 150/78, pulse 89, height 5' (1.524 m), weight 162 lb 3.2 oz (73.6 kg), last menstrual period 04/08/2019, SpO2 97%.    A:         Well Woman GYN exam                             P:        Pap smear collected today Encouraged annual mammogram screening Colon cancer screening up-to-date DXA ordered today Labs and immunizations to do with PMD Encouraged healthy lifestyle practices Encouraged Vit D and Calcium   No follow-ups on file.  Misty Lara

## 2024-03-06 ENCOUNTER — Ambulatory Visit: Payer: Self-pay | Admitting: Obstetrics and Gynecology

## 2024-03-06 LAB — CYTOLOGY - PAP
Comment: NEGATIVE
Diagnosis: UNDETERMINED — AB
High risk HPV: NEGATIVE
